# Patient Record
Sex: Female | Born: 1975 | Race: White | Hispanic: No | Marital: Single | Smoking: Current every day smoker
Health system: Southern US, Community
[De-identification: ages and names within clinical notes are randomized; demographics above are authoritative.]

## PROBLEM LIST (undated history)

## (undated) DIAGNOSIS — F32A Depression, unspecified: Secondary | ICD-10-CM

## (undated) DIAGNOSIS — F319 Bipolar disorder, unspecified: Secondary | ICD-10-CM

## (undated) DIAGNOSIS — F329 Major depressive disorder, single episode, unspecified: Secondary | ICD-10-CM

## (undated) HISTORY — PX: TONSILLECTOMY: SUR1361

## (undated) HISTORY — PX: FOOT SURGERY: SHX648

## (undated) HISTORY — PX: PARTIAL HYSTERECTOMY: SHX80

## (undated) HISTORY — PX: TUBAL LIGATION: SHX77

---

## 2001-09-14 ENCOUNTER — Inpatient Hospital Stay (HOSPITAL_COMMUNITY): Admission: AD | Admit: 2001-09-14 | Discharge: 2001-09-14 | Payer: Self-pay | Admitting: Sports Medicine

## 2001-09-24 ENCOUNTER — Emergency Department (HOSPITAL_COMMUNITY): Admission: EM | Admit: 2001-09-24 | Discharge: 2001-09-24 | Payer: Self-pay | Admitting: Emergency Medicine

## 2001-09-24 ENCOUNTER — Encounter: Payer: Self-pay | Admitting: Emergency Medicine

## 2002-01-28 ENCOUNTER — Inpatient Hospital Stay (HOSPITAL_COMMUNITY): Admission: AD | Admit: 2002-01-28 | Discharge: 2002-01-28 | Payer: Self-pay | Admitting: *Deleted

## 2002-01-31 ENCOUNTER — Inpatient Hospital Stay (HOSPITAL_COMMUNITY): Admission: AD | Admit: 2002-01-31 | Discharge: 2002-01-31 | Payer: Self-pay | Admitting: *Deleted

## 2002-02-03 ENCOUNTER — Inpatient Hospital Stay (HOSPITAL_COMMUNITY): Admission: AD | Admit: 2002-02-03 | Discharge: 2002-02-03 | Payer: Self-pay | Admitting: Obstetrics and Gynecology

## 2003-01-04 ENCOUNTER — Inpatient Hospital Stay (HOSPITAL_COMMUNITY): Admission: AD | Admit: 2003-01-04 | Discharge: 2003-01-04 | Payer: Self-pay | Admitting: *Deleted

## 2003-01-08 ENCOUNTER — Encounter: Admission: RE | Admit: 2003-01-08 | Discharge: 2003-01-08 | Payer: Self-pay | Admitting: Obstetrics and Gynecology

## 2003-01-08 ENCOUNTER — Other Ambulatory Visit: Admission: RE | Admit: 2003-01-08 | Discharge: 2003-01-08 | Payer: Self-pay | Admitting: Obstetrics and Gynecology

## 2016-12-01 ENCOUNTER — Other Ambulatory Visit: Payer: Self-pay | Admitting: *Deleted

## 2016-12-01 DIAGNOSIS — Z1231 Encounter for screening mammogram for malignant neoplasm of breast: Secondary | ICD-10-CM

## 2016-12-25 ENCOUNTER — Encounter: Payer: Self-pay | Admitting: Radiology

## 2016-12-25 ENCOUNTER — Ambulatory Visit
Admission: RE | Admit: 2016-12-25 | Discharge: 2016-12-25 | Disposition: A | Discharge status: Home / Self Care | Payer: 59 | Source: Ambulatory Visit | Attending: *Deleted | Admitting: *Deleted

## 2016-12-25 DIAGNOSIS — Z1231 Encounter for screening mammogram for malignant neoplasm of breast: Secondary | ICD-10-CM

## 2016-12-26 ENCOUNTER — Other Ambulatory Visit: Payer: Self-pay | Admitting: *Deleted

## 2016-12-26 DIAGNOSIS — R928 Other abnormal and inconclusive findings on diagnostic imaging of breast: Secondary | ICD-10-CM

## 2017-01-04 ENCOUNTER — Ambulatory Visit
Admission: RE | Admit: 2017-01-04 | Discharge: 2017-01-04 | Disposition: A | Discharge status: Home / Self Care | Payer: 59 | Source: Ambulatory Visit | Attending: *Deleted | Admitting: *Deleted

## 2017-01-04 DIAGNOSIS — R928 Other abnormal and inconclusive findings on diagnostic imaging of breast: Secondary | ICD-10-CM

## 2017-04-07 ENCOUNTER — Encounter (HOSPITAL_COMMUNITY): Payer: Self-pay

## 2017-04-07 ENCOUNTER — Emergency Department (HOSPITAL_COMMUNITY): Payer: 59

## 2017-04-07 ENCOUNTER — Emergency Department (HOSPITAL_COMMUNITY)
Admission: EM | Admit: 2017-04-07 | Discharge: 2017-04-07 | Disposition: A | Discharge status: Home / Self Care | Payer: 59 | Attending: Emergency Medicine | Admitting: Emergency Medicine

## 2017-04-07 DIAGNOSIS — Y939 Activity, unspecified: Secondary | ICD-10-CM | POA: Diagnosis not present

## 2017-04-07 DIAGNOSIS — F17210 Nicotine dependence, cigarettes, uncomplicated: Secondary | ICD-10-CM | POA: Diagnosis not present

## 2017-04-07 DIAGNOSIS — Y929 Unspecified place or not applicable: Secondary | ICD-10-CM | POA: Diagnosis not present

## 2017-04-07 DIAGNOSIS — S51811A Laceration without foreign body of right forearm, initial encounter: Secondary | ICD-10-CM | POA: Diagnosis present

## 2017-04-07 DIAGNOSIS — W25XXXA Contact with sharp glass, initial encounter: Secondary | ICD-10-CM | POA: Diagnosis not present

## 2017-04-07 DIAGNOSIS — Y999 Unspecified external cause status: Secondary | ICD-10-CM | POA: Diagnosis not present

## 2017-04-07 DIAGNOSIS — Z23 Encounter for immunization: Secondary | ICD-10-CM | POA: Insufficient documentation

## 2017-04-07 MED ORDER — LIDOCAINE HCL 2 % IJ SOLN
20.0000 mL | Freq: Once | INTRAMUSCULAR | Status: AC
  Administered 2017-04-07: 400 mg

## 2017-04-07 MED ORDER — TETANUS-DIPHTH-ACELL PERTUSSIS 5-2.5-18.5 LF-MCG/0.5 IM SUSP
0.5000 mL | Freq: Once | INTRAMUSCULAR | Status: AC
  Administered 2017-04-07: 0.5 mL via INTRAMUSCULAR
  Filled 2017-04-07: qty 0.5

## 2017-04-07 MED ORDER — LIDOCAINE-EPINEPHRINE (PF) 2 %-1:200000 IJ SOLN
INTRAMUSCULAR | Status: AC
  Filled 2017-04-07: qty 20

## 2017-04-07 NOTE — ED Provider Notes (Signed)
[facility]-EMERGENCY DEPT Provider Note   CSN: [id number] Arrival date & time: 04/07/17  1703  By signing my name below, I, Thelma Barge, attest that this documentation has been prepared under the direction and in the presence of Glenford Bayley, PA-C. Electronically Signed: Thelma Barge, Scribe. 04/07/17. 5:40 PM.  History   Chief Complaint Chief Complaint  Patient presents with  . Laceration   The history is provided by the patient. No language interpreter was used.    HPI Comments: Abigail Rhodes is a 41 y.o. female who presents to the Emergency Department complaining of constant, gradually worsening right-sided forearm s/p an accidental laceration to the area. She has associated numbness/tingling "all over" and dizziness, but this is likely due to patient's anxiety over the situation. She reports hyperventilating on arrival to the ED. Her numbness/tingling is resolving. She states she was carrying broken glass in a container up a staircase when the glass cut her wrist. The wound was cleaned PTA. Tetanus not UTD.  History reviewed. No pertinent past medical history.  There are no active problems to display for this patient.   Past Surgical History:  Procedure Laterality Date  . FOOT SURGERY    . PARTIAL HYSTERECTOMY    . TONSILLECTOMY    . TUBAL LIGATION      OB History    No data available       Home Medications    Prior to Admission medications   Not on File    Family History History reviewed. No pertinent family history.  Social History Social History  Substance Use Topics  . Smoking status: Current Every Day Smoker  . Smokeless tobacco: Never Used  . Alcohol use Yes     Comment: social     Allergies   Patient has no known allergies.   Review of Systems Review of Systems  Skin: Positive for wound.  Neurological: Positive for dizziness and numbness.     Physical Exam Updated Vital Signs BP (!) 134/92   Pulse 80   Temp 97.8 F (36.6 C) (Oral)   Resp 18    SpO2 100%   Physical Exam  Constitutional: She appears well-developed and well-nourished. No distress.  HENT:  Head: Normocephalic and atraumatic.  Mouth/Throat: Oropharynx is clear and moist. No oropharyngeal exudate.  Eyes: Pupils are equal, round, and reactive to light. Conjunctivae are normal. Right eye exhibits no discharge. Left eye exhibits no discharge. No scleral icterus.  Neck: Normal range of motion. Neck supple. No thyromegaly present.  Cardiovascular: Normal rate, regular rhythm, normal heart sounds and intact distal pulses.  Exam reveals no gallop and no friction rub.   No murmur heard. Pulmonary/Chest: Effort normal and breath sounds normal. No stridor. No respiratory distress. She has no wheezes. She has no rales.  Musculoskeletal: She exhibits no edema.  Lymphadenopathy:    She has no cervical adenopathy.  Neurological: She is alert. Coordination normal.  Skin: Skin is warm and dry. No rash noted. She is not diaphoretic. No pallor.  2 cm laceration to dorsal forearm, adipose exposed. No active bleeding; no tendon or foreign body noted in bloodless field. Normal sensation, full ROM to the hand, equal bilateral grip strength, radial pulses intact  Psychiatric: She has a normal mood and affect.  Nursing note and vitals reviewed.    ED Treatments / Results  DIAGNOSTIC STUDIES: Oxygen Saturation is 100% on RA, normal by my interpretation.    COORDINATION OF CARE: 5:40 PM Discussed treatment plan with pt at bedside  and pt agreed to plan.  Labs (all labs ordered are listed, but only abnormal results are displayed) Labs Reviewed - No data to display  EKG  EKG Interpretation None       Radiology No results found.  Procedures Procedures (including critical care time)  LACERATION REPAIR Performed by: Emi HolesAlexandra M Jilleen Essner Authorized by: Emi HolesAlexandra M Rebeca Valdivia Consent: Verbal consent obtained. Risks and benefits: risks, benefits and alternatives were discussed Consent  given by: patient Patient identity confirmed: provided demographic data Prepped and Draped in normal sterile fashion Wound explored  Laceration Location: R forearm  Laceration Length: 2cm  No Foreign Bodies seen or palpated  Anesthesia: local infiltration  Local anesthetic: lidocaine 2% w/ epinephrine  Anesthetic total: 3 ml  Irrigation method: syringe Amount of cleaning: standard  Skin closure: 4-0 Ethilon  Number of sutures: 4  Technique: Simple interrupted   Patient tolerance: Patient tolerated the procedure well with no immediate complications.   Medications Ordered in ED Medications  lidocaine-EPINEPHrine (XYLOCAINE W/EPI) 2 %-1:200000 (PF) injection (not administered)  lidocaine (XYLOCAINE) 2 % (with pres) injection 400 mg (400 mg Infiltration Given by Other 04/07/17 1937)  Tdap (BOOSTRIX) injection 0.5 mL (0.5 mLs Intramuscular Given 04/07/17 1935)     Initial Impression / Assessment and Plan / ED Course  I have reviewed the triage vital signs and the nursing notes.  Pertinent labs & imaging results that were available during my care of the patient were reviewed by me and considered in my medical decision making (see chart for details).     Tetanus updated in ED. Laceration occurred < 12 hours prior to repair. Patient refused x-ray to search for foreign body due to financial constraints. Patient understands risks of small foreign body retained in the wound, although none visualized in bloodless field after copious irrigation. Discussed laceration care with pt and answered questions. Pt to f-u for suture removal in 10 days and wound check sooner should there be signs of dehiscence or infection. Patient's anxiety improved and resolved at discharge. Patient no longer hyperventilating and feeling numbness and tingling all over. Pt is hemodynamically stable with no complaints prior to dc.  Patient discharged in satisfactory condition.  Final Clinical Impressions(s) / ED  Diagnoses   Final diagnoses:  Laceration of right forearm, initial encounter    New Prescriptions There are no discharge medications for this patient. I personally performed the services described in this documentation, which was scribed in my presence. The recorded information has been reviewed and is accurate.    Emi HolesLaw, Saadiya Wilfong M, PA-C 04/07/17 1941    Mancel BaleWentz, Elliott, MD 04/07/17 [phone]

## 2017-04-07 NOTE — Discharge Instructions (Signed)
Treatment: Keep your wound dry and dressing applied until this time tomorrow. After 24 hours, you may wash with warm soapy water. Dry and apply antibiotic ointment and clean dressing. Do this daily until your sutures are removed. ° °Follow-up: Please follow-up with your primary care provider or return to emergency department in 7-10 days for suture removal. Be aware of signs of infection: fever, increasing pain, redness, swelling, drainage from the area. Please call your primary care provider or return to emergency department if you develop any of these symptoms or if any of the sutures come out prior to removal. Please return to the emergency department if you develop any other new or worsening symptoms. ° °

## 2017-04-07 NOTE — ED Notes (Signed)
Pt refusing xray at this time

## 2017-04-07 NOTE — ED Triage Notes (Signed)
Pt here with accidental laceration to right wrist.  Pt states completely accidental.  Bleeding controlled.

## 2017-06-08 ENCOUNTER — Other Ambulatory Visit: Payer: Self-pay | Admitting: *Deleted

## 2017-06-08 DIAGNOSIS — R921 Mammographic calcification found on diagnostic imaging of breast: Secondary | ICD-10-CM

## 2017-06-08 DIAGNOSIS — N6320 Unspecified lump in the left breast, unspecified quadrant: Secondary | ICD-10-CM

## 2017-07-09 ENCOUNTER — Other Ambulatory Visit: Payer: Self-pay | Admitting: *Deleted

## 2017-07-09 ENCOUNTER — Ambulatory Visit
Admission: RE | Admit: 2017-07-09 | Discharge: 2017-07-09 | Disposition: A | Discharge status: Home / Self Care | Payer: 59 | Source: Ambulatory Visit | Attending: *Deleted | Admitting: *Deleted

## 2017-07-09 DIAGNOSIS — N6320 Unspecified lump in the left breast, unspecified quadrant: Secondary | ICD-10-CM

## 2017-07-09 DIAGNOSIS — R921 Mammographic calcification found on diagnostic imaging of breast: Secondary | ICD-10-CM

## 2017-10-20 ENCOUNTER — Inpatient Hospital Stay (HOSPITAL_COMMUNITY): Payer: 59

## 2017-10-20 ENCOUNTER — Encounter (HOSPITAL_COMMUNITY): Payer: Self-pay

## 2017-10-20 ENCOUNTER — Inpatient Hospital Stay (HOSPITAL_COMMUNITY)
Admission: EM | Admit: 2017-10-20 | Discharge: 2017-10-22 | DRG: 918 | Disposition: A | Discharge status: Other Acute Inpatient Hospital | Payer: 59 | Attending: Internal Medicine | Admitting: Internal Medicine

## 2017-10-20 ENCOUNTER — Other Ambulatory Visit: Payer: Self-pay

## 2017-10-20 DIAGNOSIS — Z8249 Family history of ischemic heart disease and other diseases of the circulatory system: Secondary | ICD-10-CM | POA: Diagnosis not present

## 2017-10-20 DIAGNOSIS — F17210 Nicotine dependence, cigarettes, uncomplicated: Secondary | ICD-10-CM | POA: Diagnosis present

## 2017-10-20 DIAGNOSIS — R000 Tachycardia, unspecified: Secondary | ICD-10-CM | POA: Diagnosis present

## 2017-10-20 DIAGNOSIS — R569 Unspecified convulsions: Secondary | ICD-10-CM | POA: Diagnosis present

## 2017-10-20 DIAGNOSIS — I4581 Long QT syndrome: Secondary | ICD-10-CM | POA: Diagnosis present

## 2017-10-20 DIAGNOSIS — F329 Major depressive disorder, single episode, unspecified: Secondary | ICD-10-CM | POA: Diagnosis present

## 2017-10-20 DIAGNOSIS — Z90710 Acquired absence of both cervix and uterus: Secondary | ICD-10-CM | POA: Diagnosis not present

## 2017-10-20 DIAGNOSIS — T50902A Poisoning by unspecified drugs, medicaments and biological substances, intentional self-harm, initial encounter: Secondary | ICD-10-CM

## 2017-10-20 DIAGNOSIS — Z888 Allergy status to other drugs, medicaments and biological substances status: Secondary | ICD-10-CM

## 2017-10-20 DIAGNOSIS — T450X2A Poisoning by antiallergic and antiemetic drugs, intentional self-harm, initial encounter: Secondary | ICD-10-CM | POA: Diagnosis present

## 2017-10-20 DIAGNOSIS — T50901A Poisoning by unspecified drugs, medicaments and biological substances, accidental (unintentional), initial encounter: Secondary | ICD-10-CM | POA: Diagnosis present

## 2017-10-20 HISTORY — DX: Major depressive disorder, single episode, unspecified: F32.9

## 2017-10-20 HISTORY — DX: Depression, unspecified: F32.A

## 2017-10-20 LAB — CBC WITH DIFFERENTIAL/PLATELET
BASOS ABS: 0 10*3/uL (ref 0.0–0.1)
BASOS PCT: 0 %
EOS ABS: 0 10*3/uL (ref 0.0–0.7)
Eosinophils Relative: 0 %
HCT: 42.3 % (ref 36.0–46.0)
HEMOGLOBIN: 15.3 g/dL — AB (ref 12.0–15.0)
LYMPHS ABS: 1 10*3/uL (ref 0.7–4.0)
Lymphocytes Relative: 6 %
MCH: 33.2 pg (ref 26.0–34.0)
MCHC: 36.2 g/dL — ABNORMAL HIGH (ref 30.0–36.0)
MCV: 91.8 fL (ref 78.0–100.0)
Monocytes Absolute: 0.2 10*3/uL (ref 0.1–1.0)
Monocytes Relative: 1 %
Neutro Abs: 16.2 10*3/uL — ABNORMAL HIGH (ref 1.7–7.7)
Neutrophils Relative %: 93 %
Platelets: 364 10*3/uL (ref 150–400)
RBC: 4.61 MIL/uL (ref 3.87–5.11)
RDW: 12.2 % (ref 11.5–15.5)
WBC: 17.4 10*3/uL — AB (ref 4.0–10.5)

## 2017-10-20 LAB — I-STAT BETA HCG BLOOD, ED (MC, WL, AP ONLY): I-stat hCG, quantitative: <5 m[IU]/mL (ref <5)

## 2017-10-20 LAB — URINALYSIS, ROUTINE W REFLEX MICROSCOPIC
Bilirubin Urine: NEGATIVE
GLUCOSE, UA: NEGATIVE mg/dL
Hgb urine dipstick: NEGATIVE
KETONES UR: NEGATIVE mg/dL
Leukocytes, UA: NEGATIVE
Nitrite: NEGATIVE
PROTEIN: 30 mg/dL — AB
Specific Gravity, Urine: 1.008 (ref 1.005–1.030)
pH: 6 (ref 5.0–8.0)

## 2017-10-20 LAB — RAPID URINE DRUG SCREEN, HOSP PERFORMED
Amphetamines: NOT DETECTED
BARBITURATES: NOT DETECTED
BENZODIAZEPINES: NOT DETECTED
Cocaine: NOT DETECTED
OPIATES: NOT DETECTED
TETRAHYDROCANNABINOL: NOT DETECTED

## 2017-10-20 LAB — COMPREHENSIVE METABOLIC PANEL
ALBUMIN: 4.3 g/dL (ref 3.5–5.0)
ALK PHOS: 64 U/L (ref 38–126)
ALT: 17 U/L (ref 14–54)
AST: 21 U/L (ref 15–41)
Anion gap: 9 (ref 5–15)
BUN: 9 mg/dL (ref 6–20)
CALCIUM: 8.7 mg/dL — AB (ref 8.9–10.3)
CHLORIDE: 110 mmol/L (ref 101–111)
CO2: 19 mmol/L — AB (ref 22–32)
CREATININE: 0.75 mg/dL (ref 0.44–1.00)
GFR calc Af Amer: >60 mL/min (ref >60)
GFR calc non Af Amer: >60 mL/min (ref >60)
GLUCOSE: 112 mg/dL — AB (ref 65–99)
Potassium: 3.5 mmol/L (ref 3.5–5.1)
SODIUM: 138 mmol/L (ref 135–145)
Total Bilirubin: 0.5 mg/dL (ref 0.3–1.2)
Total Protein: 7.7 g/dL (ref 6.5–8.1)

## 2017-10-20 LAB — ACETAMINOPHEN LEVEL: Acetaminophen (Tylenol), Serum: <10 ug/mL — ABNORMAL LOW (ref 10–30)

## 2017-10-20 LAB — ETHANOL

## 2017-10-20 LAB — CBG MONITORING, ED: GLUCOSE-CAPILLARY: 111 mg/dL — AB (ref 65–99)

## 2017-10-20 LAB — SALICYLATE LEVEL: Salicylate Lvl: <7 mg/dL (ref 2.8–30.0)

## 2017-10-20 MED ORDER — LORAZEPAM 2 MG/ML IJ SOLN
1.0000 mg | Freq: Once | INTRAMUSCULAR | Status: AC
  Administered 2017-10-20: 1 mg via INTRAVENOUS
  Filled 2017-10-20: qty 1

## 2017-10-20 MED ORDER — SODIUM CHLORIDE 0.9 % IV BOLUS (SEPSIS)
1000.0000 mL | Freq: Once | INTRAVENOUS | Status: AC
  Administered 2017-10-20: 1000 mL via INTRAVENOUS

## 2017-10-20 MED ORDER — SODIUM CHLORIDE 0.9 % IV SOLN
Freq: Once | INTRAVENOUS | Status: AC
  Administered 2017-10-20: 23:00:00 via INTRAVENOUS

## 2017-10-20 MED ORDER — PHYSOSTIGMINE SALICYLATE 1 MG/ML IJ SOLN
0.5000 mg | Freq: Once | INTRAMUSCULAR | Status: AC
  Administered 2017-10-21: 0.5 mg via INTRAVENOUS
  Filled 2017-10-20: qty 2

## 2017-10-20 NOTE — H&P (Signed)
[facility] H&P   Patient Demographics:    Abigail Rhodes, is a 42 y.o. female  MRN: [id number]   DOB - 25-Apr-1976  Admit Date - 10/20/2017  Outpatient Primary MD for the patient is Christ Kick, [place]  Referring MD/NP/PA: Reinaldo Meeker  Outpatient Specialists:     Patient coming from: home  Chief Complaint  Patient presents with  . Ingestion  . Suicide Attempt      HPI:    Abigail Rhodes  is a 42 y.o. female, w depression apparently attempted suicide attempt by ingesting 75 pills of benadryl.  Pt apparently had a boyfriend who witnessed a generalized convulsion lasting about 1 minute.    In ED,   CT brain pending.  EKG ST at 148, nl axis, QTC 528  Na 138, K 3.5, Bun 9, Creatinine 0.75 Ast 21, Alt 17 Tylenol <10 salycilate <7.0 etoh <10 UDS negative  Pt will be admitted for suicide attempt and benadryl OD, and ? Seizure.        Review of systems:    In addition to the HPI above,  No Fever-chills, No Headache, No changes with Vision or hearing, No problems swallowing food or Liquids, No Chest pain, Cough or Shortness of Breath, No Abdominal pain, No Nausea or Vommitting, Bowel movements are regular, No Blood in stool or Urine, No dysuria, No new skin rashes or bruises, No new joints pains-aches,  No new weakness, tingling, numbness in any extremity, No recent weight gain or loss, No polyuria, polydypsia or polyphagia, No significant Mental Stressors.  A full 10 point Review of Systems was done, except as stated above, all other Review of Systems were negative.   With Past History of the following :    Past Medical History:  Diagnosis Date  . Depression       Past Surgical History:  Procedure Laterality Date  . FOOT SURGERY    . PARTIAL HYSTERECTOMY    . TONSILLECTOMY    . TUBAL LIGATION        Social History:     Social History   Tobacco Use    . Smoking status: Current Every Day Smoker    Types: Cigarettes  . Smokeless tobacco: Never Used  Substance Use Topics  . Alcohol use: Yes    Comment: social     Lives - at home  Mobility - walks by self   Family History :     Family History  Problem Relation Age of Onset  . Hypertension Father       Home Medications:   Prior to Admission medications   Medication Sig Start Date End Date Taking? Authorizing Provider  PREVIDENT 5000 DRY MOUTH 1.1 % GEL dental gel Take 1 application by mouth 2 (two) times daily. 10/17/17  Yes [provider]  sertraline (ZOLOFT) 25 MG tablet Take 25 mg by mouth daily. 10/17/17  Yes [provider]     Allergies:     Allergies  Allergen Reactions  . Pepto-Bismol [Bismuth Subsalicylate] Other (See Comments)    antsy     Physical Exam:   Vitals  Blood pressure (!) 152/103, pulse (!) 116, temperature 98.5 F (36.9 C), temperature source Oral, resp. rate (!) 31, height 5\' 3"  (1.6 m), weight 70.3 kg (155 lb), SpO2 96 %.   1. General  lying in bed in NAD,    2. Normal affect and insight, Not Suicidal or Homicidal, Awake Alert, Oriented X 3.  3. No F.N deficits, ALL C.Nerves Intact, Strength 5/5 all 4 extremities, Sensation intact all 4 extremities, Plantars down going.  4. Ears and Eyes appear Normal, Conjunctivae clear, PERRLA. Moist Oral Mucosa.  5. Supple Neck, No JVD, No cervical lymphadenopathy appriciated, No Carotid Bruits.  6. Symmetrical Chest wall movement, Good air movement bilaterally, CTAB.  7. RRR, No Gallops, Rubs or Murmurs, No Parasternal Heave.  8. Positive Bowel Sounds, Abdomen Soft, No tenderness, No organomegaly appriciated,No rebound -guarding or rigidity.  9.  No Cyanosis, Normal Skin Turgor, No Skin Rash or Bruise.  10. Good muscle tone,  joints appear normal , no effusions, Normal ROM.  11. No Palpable Lymph Nodes in Neck or Axillae    Data Review:    CBC Recent Labs  Lab  10/20/17 2043  WBC 17.4*  HGB 15.3*  HCT 42.3  PLT 364  MCV 91.8  MCH 33.2  MCHC 36.2*  RDW 12.2  LYMPHSABS 1.0  MONOABS 0.2  EOSABS 0.0  BASOSABS 0.0   ------------------------------------------------------------------------------------------------------------------  Chemistries  Recent Labs  Lab 10/20/17 2043  NA 138  K 3.5  CL 110  CO2 19*  GLUCOSE 112*  BUN 9  CREATININE 0.75  CALCIUM 8.7*  AST 21  ALT 17  ALKPHOS 64  BILITOT 0.5   ------------------------------------------------------------------------------------------------------------------ estimated creatinine clearance is 88 mL/min (by C-G formula based on SCr of 0.75 mg/dL). ------------------------------------------------------------------------------------------------------------------ No results for input(s): TSH, T4TOTAL, T3FREE, THYROIDAB in the last 72 hours.  Invalid input(s): FREET3  Coagulation profile No results for input(s): INR, PROTIME in the last 168 hours. ------------------------------------------------------------------------------------------------------------------- No results for input(s): DDIMER in the last 72 hours. -------------------------------------------------------------------------------------------------------------------  Cardiac Enzymes No results for input(s): CKMB, TROPONINI, MYOGLOBIN in the last 168 hours.  Invalid input(s): CK ------------------------------------------------------------------------------------------------------------------ No results found for: BNP   ---------------------------------------------------------------------------------------------------------------  Urinalysis    Component Value Date/Time   COLORURINE YELLOW 10/20/2017 2047   APPEARANCEUR CLEAR 10/20/2017 2047   LABSPEC 1.008 10/20/2017 2047   PHURINE 6.0 10/20/2017 2047   GLUCOSEU NEGATIVE 10/20/2017 2047   HGBUR NEGATIVE 10/20/2017 2047   BILIRUBINUR NEGATIVE 10/20/2017  2047   KETONESUR NEGATIVE 10/20/2017 2047   PROTEINUR 30 (A) 10/20/2017 2047   NITRITE NEGATIVE 10/20/2017 2047   LEUKOCYTESUR NEGATIVE 10/20/2017 2047    ----------------------------------------------------------------------------------------------------------------   Imaging Results:    No results found.    Assessment & Plan:    Principal Problem:   Overdose Active Problems:   Tachycardia   Seizure (HCC)    Overdose , with agitation Physostigmine 0.5mg  iv x1 Monitor QTC Check 12 lead EKG in am  Suicide attempt 1:1 direct obs for suicide attempt Please contact psychatry in AM for evaluation  Tachycardia Tele tsh Trop I q6h x3 Consider echo if not improving   ? Seizure likely due to overdose CT brain now MRI brain  EEG      DVT Prophylaxis  Lovenox - SCDs   AM  Labs Ordered, also please review Full Orders  Family Communication: Admission, patients condition and plan of care including tests being ordered have been discussed with the patient FULL CODE who indicate understanding and agree with the plan and Code Status.  Code Status FULL CODE  Likely DC to  home  Condition GUARDED    Consults called: none  Admission status: inpatient   Time spent in minutes : 45   Pearson Grippe M.D on 10/20/2017 at 11:35 PM  Between 7am to 7pm - Pager - [phone]  . After 7pm go to [email or web address] - password [facility]  [facility] - Office  [phone]

## 2017-10-20 NOTE — ED Triage Notes (Signed)
Pt comes from home. Pt brought in via [facility]. Pt took about 25mg  dyphehydramine around 75 to 100 capsules estimated. Witness states pt had seizure lasting about 1 min with unresponsive episode very several minutes and then coming too shortly there after. Boyfriend saw seizure happen and is the one that called.

## 2017-10-20 NOTE — ED Notes (Signed)
Talked with poison control about benadryl overdose discussed with [facility] ED provider.

## 2017-10-20 NOTE — ED Provider Notes (Signed)
[facility]-EMERGENCY DEPT Provider Note   CSN: [id number] Arrival date & time: 10/20/17  2028     History   Chief Complaint Chief Complaint  Patient presents with  . Ingestion  . Suicide Attempt    HPI Abigail Rhodes is a 42 y.o. female brought to ED via EMS for intentional Benadryl overdose at 5 PM today. EMS approximates at least 75 pills of 25 mg Benadryl taken. Boyfriend who was with patient witnessed a generalized convulsion lasting approximately 1 minute, patient was confused and not following commands afterwards. Pt admits attempting suicide today with benadryl. Pt reports thirst but no pain. Level 5 caveat applies. H/o suicide attempts before.   HPI  History reviewed. No pertinent past medical history.  Patient Active Problem List   Diagnosis Date Noted  . Overdose 10/20/2017    Past Surgical History:  Procedure Laterality Date  . FOOT SURGERY    . PARTIAL HYSTERECTOMY    . TONSILLECTOMY    . TUBAL LIGATION      OB History    No data available       Home Medications    Prior to Admission medications   Medication Sig Start Date End Date Taking? Authorizing Provider  PREVIDENT 5000 DRY MOUTH 1.1 % GEL dental gel Take 1 application by mouth 2 (two) times daily. 10/17/17  Yes [provider]  sertraline (ZOLOFT) 25 MG tablet Take 25 mg by mouth daily. 10/17/17  Yes [provider]    Family History No family history on file.  Social History Social History   Tobacco Use  . Smoking status: Current Every Day Smoker  . Smokeless tobacco: Never Used  Substance Use Topics  . Alcohol use: Yes    Comment: social  . Drug use: No     Allergies   Pepto-bismol [bismuth subsalicylate]   Review of Systems Review of Systems  Unable to perform ROS: Acuity of condition     Physical Exam Updated Vital Signs BP (!) 142/107   Pulse (!) 119   Temp 98.5 F (36.9 C) (Oral)   Resp (!) 34   Ht 5\' 3"  (1.6 m)   Wt 70.3  kg (155 lb)   SpO2 96%   BMI 27.46 kg/m   Physical Exam  Constitutional: She is oriented to person, place, and time. She appears well-developed and well-nourished.  HENT:  Head: Normocephalic and atraumatic.  Right Ear: External ear normal.  Left Ear: External ear normal.  Nose: Nose normal.  Dry mucous membranes and lips. Right lateral tongue injury/bleeding. Dentition intact no malocclusion.   Eyes: Conjunctivae are normal. No scleral icterus.  Slightly dilated pupils but reactive to light. EOMs grossly normal   Neck: Normal range of motion. Neck supple.  Cardiovascular: Regular rhythm and normal heart sounds.  No murmur heard. Tachycardic 145-150s. No LE edema.   Pulmonary/Chest: Effort normal and breath sounds normal. She has no wheezes.  Abdominal: Soft. There is no tenderness.  Musculoskeletal: Normal range of motion. She exhibits no deformity.  Neurological: She is alert and oriented to person, place, and time.  Can tall me her name.  Attempts to communicate but slow, slurred speech.  Weak hand grip bilaterally, constant tremor to bilateral upper extremities.  Knee DTRs intact bilaterally.  Unable to perform full exam given acuity of condition.   Skin: Skin is warm and dry. Capillary refill takes less than 2 seconds.  Psychiatric: She has a normal mood and affect. Her behavior is normal.  Judgment and thought content normal.  Nursing note and vitals reviewed.    ED Treatments / Results  Labs (all labs ordered are listed, but only abnormal results are displayed) Labs Reviewed  CBC WITH DIFFERENTIAL/PLATELET - Abnormal; Notable for the following components:      Result Value   WBC 17.4 (*)    Hemoglobin 15.3 (*)    MCHC 36.2 (*)    Neutro Abs 16.2 (*)    All other components within normal limits  COMPREHENSIVE METABOLIC PANEL - Abnormal; Notable for the following components:   CO2 19 (*)    Glucose, Bld 112 (*)    Calcium 8.7 (*)    All other components within  normal limits  ACETAMINOPHEN LEVEL - Abnormal; Notable for the following components:   Acetaminophen (Tylenol), Serum <10 (*)    All other components within normal limits  URINALYSIS, ROUTINE W REFLEX MICROSCOPIC - Abnormal; Notable for the following components:   Protein, ur 30 (*)    Bacteria, UA RARE (*)    Squamous Epithelial / LPF 0-5 (*)    All other components within normal limits  CBG MONITORING, ED - Abnormal; Notable for the following components:   Glucose-Capillary 111 (*)    All other components within normal limits  SALICYLATE LEVEL  ETHANOL  RAPID URINE DRUG SCREEN, HOSP PERFORMED  I-STAT BETA HCG BLOOD, ED ([facility], [facility], [facility] ONLY)    EKG  EKG Interpretation  Date/Time:  Saturday October 20 2017 22:40:53 EST Ventricular Rate:  132 PR Interval:    QRS Duration: 92 QT Interval:  356 QTC Calculation: 528 R Axis:   58 Text Interpretation:  Sinus tachycardia Posterior infarct, old Borderline repolarization abnormality Prolonged QT interval No significant change since last tracing Confirmed by Shaune PollackIsaacs, Cameron [phone]([id number]) on 10/20/2017 11:03:58 PM       Radiology No results found.  Procedures Procedures (including critical care time)  Medications Ordered in ED Medications  sodium chloride 0.9 % bolus 1,000 mL (0 mLs Intravenous Stopped 10/20/17 2144)  LORazepam (ATIVAN) injection 1 mg (1 mg Intravenous Given 10/20/17 2051)  sodium chloride 0.9 % bolus 1,000 mL (0 mLs Intravenous Stopped 10/20/17 2241)  0.9 %  sodium chloride infusion ( Intravenous New Bag/Given 10/20/17 2311)  LORazepam (ATIVAN) injection 1 mg (1 mg Intravenous Given 10/20/17 2311)     Initial Impression / Assessment and Plan / ED Course  I have reviewed the triage vital signs and the nursing notes.  Pertinent labs & imaging results that were available during my care of the patient were reviewed by me and considered in my medical decision making (see chart for details).  Clinical Course as of Oct 20 2316  Sat  Oct 20, 2017  2205 WBC: (!) 17.4 [CG]  2309 Sinus tachycardia Posterior infarct, old Borderline repolarization abnormality Prolonged QT interval No significant change since last EKG 12-Lead [CG]    Clinical Course User Index [CG] Liberty HandyGibbons, Joannie Medine J, PA-C   Spoke to poison control who recommends monitor for at least 6 hours or until clinical improvement, repeat EKGs to monitor QRS prolongation, sodium bicarb boluses 1-2 amp prn for QRS prolongation, benzodiazepines for seizure like activity.   Work up remarkable for leukocytosis. Prolonged QTc, but normal QRS. Tachycardia has improved from 150s to 120s. .   Final Clinical Impressions(s) / ED Diagnoses   Re-evaluated pt who is still tremorous and unable to speak, although trying to. She has received 2 L IVF and on maintance fluids now, 2 mg ativan.  Given persistent tachycardia will request observation. Spoke to Dr Selena Batten who will admit pt. Pt shared with SP.  Final diagnoses:  Intentional drug overdose, initial encounter [facility])    ED Discharge Orders    None       Jerrell Mylar 10/20/17 2318    Shaune Pollack, MD 10/20/17 2324

## 2017-10-20 NOTE — ED Notes (Signed)
Bed: RESB Expected date:  Expected time:  Means of arrival:  Comments: 89f overdose

## 2017-10-20 NOTE — ED Notes (Signed)
Pt has in belonging bag:  Pink sweater, grey tights, white underwear, green wife beater, black bra, [place] driver ID behind the nurses station [id number].

## 2017-10-20 NOTE — ED Notes (Signed)
Bed: [id number] Expected date:  Expected time:  Means of arrival:  Comments: Hold resus B

## 2017-10-21 DIAGNOSIS — R000 Tachycardia, unspecified: Secondary | ICD-10-CM

## 2017-10-21 DIAGNOSIS — Z630 Problems in relationship with spouse or partner: Secondary | ICD-10-CM

## 2017-10-21 DIAGNOSIS — F419 Anxiety disorder, unspecified: Secondary | ICD-10-CM

## 2017-10-21 DIAGNOSIS — R569 Unspecified convulsions: Secondary | ICD-10-CM

## 2017-10-21 DIAGNOSIS — F17210 Nicotine dependence, cigarettes, uncomplicated: Secondary | ICD-10-CM

## 2017-10-21 DIAGNOSIS — R450 Nervousness: Secondary | ICD-10-CM

## 2017-10-21 DIAGNOSIS — T450X2A Poisoning by antiallergic and antiemetic drugs, intentional self-harm, initial encounter: Principal | ICD-10-CM

## 2017-10-21 DIAGNOSIS — T50902A Poisoning by unspecified drugs, medicaments and biological substances, intentional self-harm, initial encounter: Secondary | ICD-10-CM

## 2017-10-21 DIAGNOSIS — F332 Major depressive disorder, recurrent severe without psychotic features: Secondary | ICD-10-CM

## 2017-10-21 DIAGNOSIS — G4700 Insomnia, unspecified: Secondary | ICD-10-CM

## 2017-10-21 DIAGNOSIS — T1491XA Suicide attempt, initial encounter: Secondary | ICD-10-CM

## 2017-10-21 LAB — COMPREHENSIVE METABOLIC PANEL
ALT: 16 U/L (ref 14–54)
ANION GAP: 7 (ref 5–15)
AST: 22 U/L (ref 15–41)
Albumin: 3.9 g/dL (ref 3.5–5.0)
Alkaline Phosphatase: 54 U/L (ref 38–126)
BILIRUBIN TOTAL: 0.3 mg/dL (ref 0.3–1.2)
BUN: 6 mg/dL (ref 6–20)
CALCIUM: 8.3 mg/dL — AB (ref 8.9–10.3)
CO2: 19 mmol/L — ABNORMAL LOW (ref 22–32)
Chloride: 112 mmol/L — ABNORMAL HIGH (ref 101–111)
Creatinine, Ser: 0.62 mg/dL (ref 0.44–1.00)
GFR calc Af Amer: >60 mL/min (ref >60)
GFR calc non Af Amer: >60 mL/min (ref >60)
Glucose, Bld: 91 mg/dL (ref 65–99)
POTASSIUM: 3.3 mmol/L — AB (ref 3.5–5.1)
Sodium: 138 mmol/L (ref 135–145)
Total Protein: 6.5 g/dL (ref 6.5–8.1)

## 2017-10-21 LAB — TSH: TSH: 1.267 u[IU]/mL (ref 0.350–4.500)

## 2017-10-21 LAB — CBC
HCT: 37.2 % (ref 36.0–46.0)
HEMOGLOBIN: 13.2 g/dL (ref 12.0–15.0)
MCH: 32.9 pg (ref 26.0–34.0)
MCHC: 35.5 g/dL (ref 30.0–36.0)
MCV: 92.8 fL (ref 78.0–100.0)
PLATELETS: 323 10*3/uL (ref 150–400)
RBC: 4.01 MIL/uL (ref 3.87–5.11)
RDW: 12.5 % (ref 11.5–15.5)
WBC: 15 10*3/uL — ABNORMAL HIGH (ref 4.0–10.5)

## 2017-10-21 LAB — CK TOTAL AND CKMB (NOT AT ARMC)
CK TOTAL: 558 U/L — AB (ref 38–234)
CK, MB: 4 ng/mL (ref 0.5–5.0)
Relative Index: 0.7 (ref 0.0–2.5)

## 2017-10-21 LAB — MAGNESIUM: Magnesium: 2 mg/dL (ref 1.7–2.4)

## 2017-10-21 LAB — HIV ANTIBODY (ROUTINE TESTING W REFLEX): HIV Screen 4th Generation wRfx: NONREACTIVE

## 2017-10-21 MED ORDER — ACETAMINOPHEN 325 MG PO TABS: 650.0000 mg | ORAL_TABLET | Freq: Four times a day (QID) | ORAL | Status: DC | PRN

## 2017-10-21 MED ORDER — SODIUM CHLORIDE 0.9 % IV SOLN
INTRAVENOUS | Status: DC
  Administered 2017-10-21: 03:00:00 via INTRAVENOUS

## 2017-10-21 MED ORDER — LORAZEPAM 2 MG/ML IJ SOLN
1.0000 mg | Freq: Once | INTRAMUSCULAR | Status: AC
Start: 2017-10-21 — End: 2017-10-21
  Administered 2017-10-21: 1 mg via INTRAVENOUS
  Filled 2017-10-21: qty 1

## 2017-10-21 MED ORDER — ACETAMINOPHEN 650 MG RE SUPP: 650.0000 mg | Freq: Four times a day (QID) | RECTAL | Status: DC | PRN

## 2017-10-21 MED ORDER — LORAZEPAM 1 MG PO TABS
2.0000 mg | ORAL_TABLET | Freq: Two times a day (BID) | ORAL | Status: DC
  Administered 2017-10-21 – 2017-10-22 (×3): 2 mg via ORAL
  Filled 2017-10-21 (×3): qty 2

## 2017-10-21 MED ORDER — LORAZEPAM 2 MG/ML IJ SOLN
2.0000 mg | Freq: Four times a day (QID) | INTRAMUSCULAR | Status: DC | PRN
  Administered 2017-10-21: 2 mg via INTRAVENOUS
  Filled 2017-10-21 (×2): qty 1

## 2017-10-21 MED ORDER — NICOTINE 14 MG/24HR TD PT24
14.0000 mg | MEDICATED_PATCH | Freq: Every day | TRANSDERMAL | Status: DC
  Administered 2017-10-21 – 2017-10-22 (×2): 14 mg via TRANSDERMAL
  Filled 2017-10-21 (×2): qty 1

## 2017-10-21 MED ORDER — GABAPENTIN 300 MG PO CAPS
300.0000 mg | ORAL_CAPSULE | Freq: Two times a day (BID) | ORAL | Status: DC
  Administered 2017-10-21 – 2017-10-22 (×3): 300 mg via ORAL
  Filled 2017-10-21 (×3): qty 1

## 2017-10-21 MED ORDER — TRAZODONE HCL 100 MG PO TABS: 100.0000 mg | ORAL_TABLET | Freq: Every day | ORAL | Status: DC

## 2017-10-21 NOTE — Progress Notes (Signed)
PROGRESS NOTE    Abigail Rhodes  ZOX:[id number] DOB: 13-Apr-1976 DOA: 10/20/2017 PCP: Christ Kick, PA    Brief Narrative:  42 y.o. female, w depression apparently attempted suicide attempt by ingesting 75 pills of benadryl.  Pt apparently had a boyfriend who witnessed a generalized convulsion lasting about 1 minute.      Assessment & Plan:   Principal Problem:   Overdose Active Problems:   Tachycardia   Seizure (HCC)  Intentional Benadryl Overdose , with agitation, suicide attempt -Given Physostigmine 0.5mg  iv x1 at time of admission -Patient presented with prolonged QTc in excess of 500, now trending down -Continue serial EKG -Psychiatry consulted. Recommendation for inpt psych when medically cleared. Not clear at present  Suicide attempt -Per above, psych consulted. Appreciate input -Continue sitter  Tachycardia -Had been sinus this AM -Will follow serial EKG  ? Seizure likely due to overdose -EEG ordered, pending -Seizure free thus far   DVT prophylaxis: SCD's Code Status: Full Family Communication: Pt in room, family not at bedside Disposition Plan: Inpt Psych when medically clear, timing uncertain  Consultants:   Psychiatry  Procedures:     Antimicrobials: Anti-infectives (From admission, onward)   None       Subjective: Confused this AM  Objective: Vitals:   10/21/17 0400 10/21/17 0454 10/21/17 0500 10/21/17 0536  BP: 118/78 (!) 148/104  136/89  Pulse: 99 90  (!) 105  Resp: (!) 22 20  20   Temp:    98 F (36.7 C)  TempSrc:      SpO2: 93% 95%  99%  Weight:   71.6 kg (157 lb 13.6 oz)   Height:        Intake/Output Summary (Last 24 hours) at 10/21/2017 1421 Last data filed at 10/21/2017 1223 Gross per 24 hour  Intake 3540 ml  Output -  Net 3540 ml   Filed Weights   10/20/17 2039 10/21/17 0500  Weight: 70.3 kg (155 lb) 71.6 kg (157 lb 13.6 oz)    Examination:  General exam: Appears calm and comfortable  Respiratory system:  Clear to auscultation. Respiratory effort normal. Cardiovascular system: S1 & S2 heard, RRR Gastrointestinal system: Abdomen is nondistended, soft and nontender. No organomegaly or masses felt. Normal bowel sounds heard. Central nervous system: Alert disoriented. No focal neurological deficits. Extremities: Symmetric 5 x 5 power. Skin: No rashes, lesions Psychiatry:difficult to assess given confusion  Data Reviewed: I have personally reviewed following labs and imaging studies  CBC: Recent Labs  Lab 10/20/17 2043 10/21/17 0442  WBC 17.4* 15.0*  NEUTROABS 16.2*  --   HGB 15.3* 13.2  HCT 42.3 37.2  MCV 91.8 92.8  PLT 364 323   Basic Metabolic Panel: Recent Labs  Lab 10/20/17 2043 10/21/17 0442  NA 138 138  K 3.5 3.3*  CL 110 112*  CO2 19* 19*  GLUCOSE 112* 91  BUN 9 6  CREATININE 0.75 0.62  CALCIUM 8.7* 8.3*  MG  --  2.0   GFR: Estimated Creatinine Clearance: 88.7 mL/min (by C-G formula based on SCr of 0.62 mg/dL). Liver Function Tests: Recent Labs  Lab 10/20/17 2043 10/21/17 0442  AST 21 22  ALT 17 16  ALKPHOS 64 54  BILITOT 0.5 0.3  PROT 7.7 6.5  ALBUMIN 4.3 3.9   No results for input(s): LIPASE, AMYLASE in the last 168 hours. No results for input(s): AMMONIA in the last 168 hours. Coagulation Profile: No results for input(s): INR, PROTIME in the last 168 hours. Cardiac Enzymes:  Recent Labs  Lab 10/21/17 0557  CKTOTAL 558*  CKMB 4.0   BNP (last 3 results) No results for input(s): PROBNP in the last 8760 hours. HbA1C: No results for input(s): HGBA1C in the last 72 hours. CBG: Recent Labs  Lab 10/20/17 2042  GLUCAP 111*   Lipid Profile: No results for input(s): CHOL, HDL, LDLCALC, TRIG, CHOLHDL, LDLDIRECT in the last 72 hours. Thyroid Function Tests: Recent Labs    10/21/17 0557  TSH 1.267   Anemia Panel: No results for input(s): VITAMINB12, FOLATE, FERRITIN, TIBC, IRON, RETICCTPCT in the last 72 hours. Sepsis Labs: No results for  input(s): PROCALCITON, LATICACIDVEN in the last 168 hours.  No results found for this or any previous visit (from the past 240 hour(s)).   Radiology Studies: Ct Head Wo Contrast  Result Date: 10/20/2017 CLINICAL DATA:  Overdose, seizure. EXAM: CT HEAD WITHOUT CONTRAST TECHNIQUE: Contiguous axial images were obtained from the base of the skull through the vertex without intravenous contrast. COMPARISON:  None. FINDINGS: BRAIN: No intraparenchymal hemorrhage, mass effect nor midline shift. The ventricles and sulci are normal. No acute large vascular territory infarcts. No abnormal extra-axial fluid collections. Basal cisterns are patent. Cerebellar tonsils at but not below the foramen magnum. VASCULAR: Unremarkable. SKULL/SOFT TISSUES: No skull fracture. No significant soft tissue swelling. ORBITS/SINUSES: The included ocular globes and orbital contents are normal.The mastoid aircells and included paranasal sinuses are well-aerated. OTHER: None. IMPRESSION: Normal noncontrast CT HEAD. Electronically Signed   By: Awilda Metroourtnay  Bloomer M.D.   On: 10/20/2017 23:58    Scheduled Meds: . gabapentin  300 mg Oral BID  . LORazepam  2 mg Oral BID  . nicotine  14 mg Transdermal Daily   Continuous Infusions: . sodium chloride 75 mL/hr at 10/21/17 0236     LOS: 1 day   Rickey BarbaraStephen Brendalee Matthies, MD [facility] Pager [phone]  If 7PM-7AM, please contact night-coverage [email or web address] Password [email or web address] 10/21/2017, 2:21 PM

## 2017-10-21 NOTE — Consult Note (Addendum)
[facility] Consult   Reason for Consult: suicide attempts, depression Referring Physician:  EDP Patient Identification: Abigail Rhodes MRN:  [id number] Principal Diagnosis: Overdose Diagnosis:   Patient Active Problem List   Diagnosis Date Noted  . Overdose [T50.901A] 10/20/2017  . Tachycardia [R00.0] 10/20/2017  . Seizure ([place]) [R56.9] 10/20/2017    Total Time spent with patient: 45 minutes  Subjective:   Abigail Rhodes is a 42 y.o. female patient admitted after suicide attempt.  HPI:  Patient who reports history of Major depressive disorder, weaned herself off medication about 2 years ago. She was brought to the hospital by  EMS after she intentional overdosed on 75 pills of 25 mg Benadryl. Patient is currently confused with slurred speech and with her permission history was obtained from her adult daughter, Angelica Pou. Daughter reports that patient has confided in her that her boyfriend has asked her to leave and no longer interested in her. She states that they have been arguing a lot. Patient reports worsening depression, stress and being overwhelmed. When she could no longer take she overdosed yesterday in an attempt to kill herself. She denies psychosis, delusions, drug use but reports occasional alcohol use. Patient appears confused, disoriented and has not slept in the last 24 hours.   Past Psychiatric History: MDD  Risk to Self: Is patient at risk for suicide?: Yes Risk to Others:   Prior Inpatient Therapy:   Prior Outpatient Therapy:    Past Medical History:  Past Medical History:  Diagnosis Date  . Depression     Past Surgical History:  Procedure Laterality Date  . FOOT SURGERY    . PARTIAL HYSTERECTOMY    . TONSILLECTOMY    . TUBAL LIGATION     Family History:  Family History  Problem Relation Age of Onset  . Hypertension Father    Family Psychiatric  History:  Social History:  Social History   Substance and Sexual Activity  Alcohol Use Yes    Comment: social     Social History   Substance and Sexual Activity  Drug Use No    Social History   Socioeconomic History  . Marital status: Single    Spouse name: None  . Number of children: None  . Years of education: None  . Highest education level: None  Social Needs  . Financial resource strain: None  . Food insecurity - worry: None  . Food insecurity - inability: None  . Transportation needs - medical: None  . Transportation needs - non-medical: None  Occupational History  . None  Tobacco Use  . Smoking status: Current Every Day Smoker    Types: Cigarettes  . Smokeless tobacco: Never Used  Substance and Sexual Activity  . Alcohol use: Yes    Comment: social  . Drug use: No  . Sexual activity: None  Other Topics Concern  . None  Social History Narrative  . None   Additional Social History:    Allergies:   Allergies  Allergen Reactions  . Pepto-Bismol [Bismuth Subsalicylate] Other (See Comments)    antsy    Labs:  Results for orders placed or performed during the hospital encounter of 10/20/17 (from the past 48 hour(s))  CBG monitoring, ED     Status: Abnormal   Collection Time: 10/20/17  8:42 PM  Result Value Ref Range   Glucose-Capillary 111 (H) 65 - 99 mg/dL   Comment 1 Notify RN    Comment 2 Document in Chart   CBC with  Differential     Status: Abnormal   Collection Time: 10/20/17  8:43 PM  Result Value Ref Range   WBC 17.4 (H) 4.0 - 10.5 K/uL   RBC 4.61 3.87 - 5.11 MIL/uL   Hemoglobin 15.3 (H) 12.0 - 15.0 g/dL   HCT 42.3 36.0 - 46.0 %   MCV 91.8 78.0 - 100.0 fL   MCH 33.2 26.0 - 34.0 pg   MCHC 36.2 (H) 30.0 - 36.0 g/dL   RDW 12.2 11.5 - 15.5 %   Platelets 364 150 - 400 K/uL   Neutrophils Relative % 93 %   Neutro Abs 16.2 (H) 1.7 - 7.7 K/uL   Lymphocytes Relative 6 %   Lymphs Abs 1.0 0.7 - 4.0 K/uL   Monocytes Relative 1 %   Monocytes Absolute 0.2 0.1 - 1.0 K/uL   Eosinophils Relative 0 %   Eosinophils Absolute 0.0 0.0 - 0.7 K/uL    Basophils Relative 0 %   Basophils Absolute 0.0 0.0 - 0.1 K/uL    Comment: Performed at [facility], [place] [address], [place], [place] [address]  Comprehensive metabolic panel     Status: Abnormal   Collection Time: 10/20/17  8:43 PM  Result Value Ref Range   Sodium 138 135 - 145 mmol/L   Potassium 3.5 3.5 - 5.1 mmol/L   Chloride 110 101 - 111 mmol/L   CO2 19 (L) 22 - 32 mmol/L   Glucose, Bld 112 (H) 65 - 99 mg/dL   BUN 9 6 - 20 mg/dL   Creatinine, Ser 0.75 0.44 - 1.00 mg/dL   Calcium 8.7 (L) 8.9 - 10.3 mg/dL   Total Protein 7.7 6.5 - 8.1 g/dL   Albumin 4.3 3.5 - 5.0 g/dL   AST 21 15 - 41 U/L   ALT 17 14 - 54 U/L   Alkaline Phosphatase 64 38 - 126 U/L   Total Bilirubin 0.5 0.3 - 1.2 mg/dL   GFR calc non Af Amer >60 >60 mL/min   GFR calc Af Amer >60 >60 mL/min    Comment: (NOTE) The eGFR has been calculated using the CKD EPI equation. This calculation has not been validated in all clinical situations. eGFR's persistently <60 mL/min signify possible Chronic Kidney Disease.    Anion gap 9 5 - 15    Comment: Performed at [facility], [place] [address] [place], [place] [address]  Acetaminophen level     Status: Abnormal   Collection Time: 10/20/17  8:43 PM  Result Value Ref Range   Acetaminophen (Tylenol), Serum <10 (L) 10 - 30 ug/mL    Comment:        THERAPEUTIC CONCENTRATIONS VARY SIGNIFICANTLY. A RANGE OF 10-30 ug/mL MAY BE AN EFFECTIVE CONCENTRATION FOR MANY PATIENTS. HOWEVER, SOME ARE BEST TREATED AT CONCENTRATIONS OUTSIDE THIS RANGE. ACETAMINOPHEN CONCENTRATIONS >150 ug/mL AT 4 HOURS AFTER INGESTION AND >50 ug/mL AT 12 HOURS AFTER INGESTION ARE OFTEN ASSOCIATED WITH TOXIC REACTIONS. Performed at [facility], [place] [address] [place], [place] [address]   Salicylate level     Status: None   Collection Time: 10/20/17  8:43 PM  Result Value Ref Range   Salicylate Lvl <1.4 2.8 - 30.0 mg/dL    Comment: Performed  at [facility], [place] [address] [place], [place] [address]  Ethanol     Status: None   Collection Time: 10/20/17  8:43 PM  Result Value Ref Range   Alcohol, Ethyl (B) <10 <10 mg/dL  Comment:        LOWEST DETECTABLE LIMIT FOR SERUM ALCOHOL IS 10 mg/dL FOR MEDICAL PURPOSES ONLY Performed at [facility] [address], [place], [place] [address]   Urinalysis, Routine w reflex microscopic     Status: Abnormal   Collection Time: 10/20/17  8:47 PM  Result Value Ref Range   Color, Urine YELLOW YELLOW   APPearance CLEAR CLEAR   Specific Gravity, Urine 1.008 1.005 - 1.030   pH 6.0 5.0 - 8.0   Glucose, UA NEGATIVE NEGATIVE mg/dL   Hgb urine dipstick NEGATIVE NEGATIVE   Bilirubin Urine NEGATIVE NEGATIVE   Ketones, ur NEGATIVE NEGATIVE mg/dL   Protein, ur 30 (A) NEGATIVE mg/dL   Nitrite NEGATIVE NEGATIVE   Leukocytes, UA NEGATIVE NEGATIVE   RBC / HPF 0-5 0 - 5 RBC/hpf   WBC, UA 0-5 0 - 5 WBC/hpf   Bacteria, UA RARE (A) NONE SEEN   Squamous Epithelial / LPF 0-5 (A) NONE SEEN   Mucus PRESENT     Comment: Performed at [facility], [place] [address], [place], [place] [address]  Rapid urine drug screen (hospital performed)     Status: None   Collection Time: 10/20/17  8:47 PM  Result Value Ref Range   Opiates NONE DETECTED NONE DETECTED   Cocaine NONE DETECTED NONE DETECTED   Benzodiazepines NONE DETECTED NONE DETECTED   Amphetamines NONE DETECTED NONE DETECTED   Tetrahydrocannabinol NONE DETECTED NONE DETECTED   Barbiturates NONE DETECTED NONE DETECTED    Comment: (NOTE) DRUG SCREEN FOR MEDICAL PURPOSES ONLY.  IF CONFIRMATION IS NEEDED FOR ANY PURPOSE, NOTIFY LAB WITHIN 5 DAYS. LOWEST DETECTABLE LIMITS FOR URINE DRUG SCREEN Drug Class                     Cutoff (ng/mL) Amphetamine and metabolites    1000 Barbiturate and metabolites    200 Benzodiazepine                 902 Tricyclics and metabolites     300 Opiates and  metabolites        300 Cocaine and metabolites        300 THC                            50 Performed at [facility], [place] [address] [place],  [address]   I-Stat Beta hCG blood, ED ([facility], [facility], [facility] only)     Status: None   Collection Time: 10/20/17  8:48 PM  Result Value Ref Range   I-stat hCG, quantitative <5.0 <5 mIU/mL   Comment 3            Comment:   GEST. AGE      CONC.  (mIU/mL)   <=1 WEEK        5 - 50     2 WEEKS       50 - 500     3 WEEKS       100 - 10,000     4 WEEKS     1,000 - 30,000        FEMALE AND NON-PREGNANT FEMALE:     LESS THAN 5 mIU/mL   Comprehensive metabolic panel     Status: Abnormal   Collection Time: 10/21/17  4:42 AM  Result Value Ref Range   Sodium 138 135 - 145 mmol/L   Potassium 3.3 (L) 3.5 -  5.1 mmol/L   Chloride 112 (H) 101 - 111 mmol/L   CO2 19 (L) 22 - 32 mmol/L   Glucose, Bld 91 65 - 99 mg/dL   BUN 6 6 - 20 mg/dL   Creatinine, Ser 0.62 0.44 - 1.00 mg/dL   Calcium 8.3 (L) 8.9 - 10.3 mg/dL   Total Protein 6.5 6.5 - 8.1 g/dL   Albumin 3.9 3.5 - 5.0 g/dL   AST 22 15 - 41 U/L   ALT 16 14 - 54 U/L   Alkaline Phosphatase 54 38 - 126 U/L   Total Bilirubin 0.3 0.3 - 1.2 mg/dL   GFR calc non Af Amer >60 >60 mL/min   GFR calc Af Amer >60 >60 mL/min    Comment: (NOTE) The eGFR has been calculated using the CKD EPI equation. This calculation has not been validated in all clinical situations. eGFR's persistently <60 mL/min signify possible Chronic Kidney Disease.    Anion gap 7 5 - 15    Comment: Performed at [facility], [place] [address] [place], [place] [address]  CBC     Status: Abnormal   Collection Time: 10/21/17  4:42 AM  Result Value Ref Range   WBC 15.0 (H) 4.0 - 10.5 K/uL   RBC 4.01 3.87 - 5.11 MIL/uL   Hemoglobin 13.2 12.0 - 15.0 g/dL   HCT 37.2 36.0 - 46.0 %   MCV 92.8 78.0 - 100.0 fL   MCH 32.9 26.0 - 34.0 pg   MCHC 35.5 30.0 - 36.0 g/dL   RDW 12.5 11.5 - 15.5 %   Platelets 323 150 -  400 K/uL    Comment: Performed at [facility], [place] [address] [place], [place] [address]  Magnesium     Status: None   Collection Time: 10/21/17  4:42 AM  Result Value Ref Range   Magnesium 2.0 1.7 - 2.4 mg/dL    Comment: Performed at [facility], [place] [address] [place], [place] [address]  TSH     Status: None   Collection Time: 10/21/17  5:57 AM  Result Value Ref Range   TSH 1.267 0.350 - 4.500 uIU/mL    Comment: Performed by a 3rd Generation assay with a functional sensitivity of <=0.01 uIU/mL. Performed at [facility], [place] [address] [place], [place] [address]   CK total and CKMB (cardiac)not at [facility]     Status: Abnormal   Collection Time: 10/21/17  5:57 AM  Result Value Ref Range   Total CK 558 (H) 38 - 234 U/L   CK, MB 4.0 0.5 - 5.0 ng/mL   Relative Index 0.7 0.0 - 2.5    Comment: Performed at [place] [address], [place],  [address]    Current Facility-Administered Medications  Medication Dose Route Frequency Provider Last Rate Last Dose  . 0.9 %  sodium chloride infusion   Intravenous Continuous Jani Gravel, MD 75 mL/hr at 10/21/17 0236    . acetaminophen (TYLENOL) tablet 650 mg  650 mg Oral Q6H PRN Jani Gravel, MD       Or  . acetaminophen (TYLENOL) suppository 650 mg  650 mg Rectal Q6H PRN Jani Gravel, MD      . gabapentin (NEURONTIN) capsule 300 mg  300 mg Oral BID Keisy Strickler, MD      . LORazepam (ATIVAN) injection 2 mg  2 mg Intravenous Q6H PRN Jani Gravel, MD   2 mg at 10/21/17 0606  . LORazepam (  ATIVAN) tablet 2 mg  2 mg Oral BID Corena Pilgrim, MD        Musculoskeletal: Strength & Muscle Tone: within normal limits Gait & Station: unsteady Patient leans: Front  Psychiatric Specialty Exam: Physical Exam  Psychiatric: Her mood appears anxious. Her speech is delayed and slurred. She is slowed and withdrawn. Cognition and memory are normal. She expresses impulsivity. She  exhibits a depressed mood. She expresses suicidal ideation.    Review of Systems  Constitutional: Negative.   HENT: Negative.   Eyes: Negative.   Respiratory: Negative.   Cardiovascular: Negative.   Gastrointestinal: Positive for nausea.  Genitourinary: Negative.   Musculoskeletal: Negative.   Skin: Negative.   Neurological: Positive for tremors.  Endo/Heme/Allergies: Negative.   Psychiatric/Behavioral: Positive for depression and suicidal ideas. The patient is nervous/anxious and has insomnia.     Blood pressure 136/89, pulse (!) 105, temperature 98 F (36.7 C), resp. rate 20, height _0  (1.6 m), weight 71.6 kg (157 lb 13.6 oz), SpO2 99 %.Body mass index is 27.96 kg/m.  General Appearance: Casual  Eye Contact:  Minimal  Speech:  Garbled, Pressured and Slurred  Volume:  Decreased  Mood:  Anxious, Depressed, Dysphoric and Hopeless  Affect:  Constricted  Thought Process:  Disorganized  Orientation:  Other:  only to person and place  Thought Content:  Logical and Rumination  Suicidal Thoughts:  Yes.  without intent/plan  Homicidal Thoughts:  No  Memory:  Immediate;   Fair Recent;   Fair Remote;   Fair  Judgement:  Impaired  Insight:  Shallow  Psychomotor Activity:  Decreased and Psychomotor Retardation  Concentration:  Concentration: Poor and Attention Span: Poor  Recall:  [employer] of Knowledge:  Fair  Language:  Fair  Akathisia:  No  Handed:  Right  AIMS (if indicated):     Assets:  Communication Skills Desire for Improvement Social Support  ADL's:  Intact  Cognition:  WNL  Sleep:   poor     Treatment Plan Summary: Daily contact with patient to assess and evaluate symptoms and progress in treatment and Medication management Diagnosis:  Major depressive disorder-recurrent episode severe  Plan/Recommendations: -Continue 1:1sitter for safety -Ativan 2 mg bid x 48 hours due to severe anxiety/insomnia -Gabapentin 300 mg bid for anxiety/mood/agitation -Monitor  QTC, CK level  Disposition: Recommend psychiatric Inpatient admission when medically cleared.  Corena Pilgrim, MD 10/21/2017 12:08 PM

## 2017-10-21 NOTE — ED Notes (Signed)
Spoke with poison control and suggests getting another EKG 2-3 hours from last. Suggests watching potassium and magnesium levels to be high end of normal due to prolonged QRS. Add CPK lab work.

## 2017-10-21 NOTE — ED Notes (Signed)
ED TO INPATIENT HANDOFF REPORT  Name/Age/Gender Abigail Rhodes 42 y.o. female  Code Status    Code Status Orders  (From admission, onward)        Start     Ordered   10/21/17 0149  Full code  Continuous     10/21/17 0148    Code Status History    Date Active Date Inactive Code Status Order ID Comments User Context   This patient has a current code status but no historical code status.      Home/SNF/Other Home  Chief Complaint Overdose  Level of Care/Admitting Diagnosis ED Disposition    ED Disposition Condition Comment   Admit  Hospital Area: [facility] [[id number]]  Level of Care: Telemetry [5]  Admit to tele based on following criteria: Monitor QTC interval  Diagnosis: Overdose [841660]  Admitting Physician: Jani Gravel [[id number]]  Attending Physician: Jani Gravel [phone]  Estimated length of stay: past midnight tomorrow  Certification:: I certify this patient will need inpatient services for at least 2 midnights  PT Class (Do Not Modify): Inpatient [101]  PT Acc Code (Do Not Modify): Private [1]       Medical History Past Medical History:  Diagnosis Date  . Depression     Allergies Allergies  Allergen Reactions  . Pepto-Bismol [Bismuth Subsalicylate] Other (See Comments)    antsy    IV Location/Drains/Wounds Patient Lines/Drains/Airways Status   Active Line/Drains/Airways    Name:   Placement date:   Placement time:   Site:   Days:   Peripheral IV 10/20/17 Left Hand   10/20/17    2033    Hand   1   Peripheral IV 10/20/17 Right Antecubital   10/20/17    2043    Antecubital   1          Labs/Imaging Results for orders placed or performed during the hospital encounter of 10/20/17 (from the past 48 hour(s))  CBG monitoring, ED     Status: Abnormal   Collection Time: 10/20/17  8:42 PM  Result Value Ref Range   Glucose-Capillary 111 (H) 65 - 99 mg/dL   Comment 1 Notify RN    Comment 2 Document in Chart   CBC with Differential      Status: Abnormal   Collection Time: 10/20/17  8:43 PM  Result Value Ref Range   WBC 17.4 (H) 4.0 - 10.5 K/uL   RBC 4.61 3.87 - 5.11 MIL/uL   Hemoglobin 15.3 (H) 12.0 - 15.0 g/dL   HCT 42.3 36.0 - 46.0 %   MCV 91.8 78.0 - 100.0 fL   MCH 33.2 26.0 - 34.0 pg   MCHC 36.2 (H) 30.0 - 36.0 g/dL   RDW 12.2 11.5 - 15.5 %   Platelets 364 150 - 400 K/uL   Neutrophils Relative % 93 %   Neutro Abs 16.2 (H) 1.7 - 7.7 K/uL   Lymphocytes Relative 6 %   Lymphs Abs 1.0 0.7 - 4.0 K/uL   Monocytes Relative 1 %   Monocytes Absolute 0.2 0.1 - 1.0 K/uL   Eosinophils Relative 0 %   Eosinophils Absolute 0.0 0.0 - 0.7 K/uL   Basophils Relative 0 %   Basophils Absolute 0.0 0.0 - 0.1 K/uL    Comment: Performed at [facility], [place] [address] [place], [place] [address]  Comprehensive metabolic panel     Status: Abnormal   Collection Time: 10/20/17  8:43 PM  Result Value Ref Range   Sodium  138 135 - 145 mmol/L   Potassium 3.5 3.5 - 5.1 mmol/L   Chloride 110 101 - 111 mmol/L   CO2 19 (L) 22 - 32 mmol/L   Glucose, Bld 112 (H) 65 - 99 mg/dL   BUN 9 6 - 20 mg/dL   Creatinine, Ser 0.75 0.44 - 1.00 mg/dL   Calcium 8.7 (L) 8.9 - 10.3 mg/dL   Total Protein 7.7 6.5 - 8.1 g/dL   Albumin 4.3 3.5 - 5.0 g/dL   AST 21 15 - 41 U/L   ALT 17 14 - 54 U/L   Alkaline Phosphatase 64 38 - 126 U/L   Total Bilirubin 0.5 0.3 - 1.2 mg/dL   GFR calc non Af Amer >60 >60 mL/min   GFR calc Af Amer >60 >60 mL/min    Comment: (NOTE) The eGFR has been calculated using the CKD EPI equation. This calculation has not been validated in all clinical situations. eGFR's persistently <60 mL/min signify possible Chronic Kidney Disease.    Anion gap 9 5 - 15    Comment: Performed at [facility], [place] [address] [place], [place] [address]  Acetaminophen level     Status: Abnormal   Collection Time: 10/20/17  8:43 PM  Result Value Ref Range   Acetaminophen (Tylenol), Serum <10 (L) 10 - 30 ug/mL     Comment:        THERAPEUTIC CONCENTRATIONS VARY SIGNIFICANTLY. A RANGE OF 10-30 ug/mL MAY BE AN EFFECTIVE CONCENTRATION FOR MANY PATIENTS. HOWEVER, SOME ARE BEST TREATED AT CONCENTRATIONS OUTSIDE THIS RANGE. ACETAMINOPHEN CONCENTRATIONS >150 ug/mL AT 4 HOURS AFTER INGESTION AND >50 ug/mL AT 12 HOURS AFTER INGESTION ARE OFTEN ASSOCIATED WITH TOXIC REACTIONS. Performed at [facility], [place] [address] [place], [place] [address]   Salicylate level     Status: None   Collection Time: 10/20/17  8:43 PM  Result Value Ref Range   Salicylate Lvl <2.7 2.8 - 30.0 mg/dL    Comment: Performed at [facility], [place] [address] [place], [place] [address]  Ethanol     Status: None   Collection Time: 10/20/17  8:43 PM  Result Value Ref Range   Alcohol, Ethyl (B) <10 <10 mg/dL    Comment:        LOWEST DETECTABLE LIMIT FOR SERUM ALCOHOL IS 10 mg/dL FOR MEDICAL PURPOSES ONLY Performed at [facility], [place] [address] [place], [place] [address]   Urinalysis, Routine w reflex microscopic     Status: Abnormal   Collection Time: 10/20/17  8:47 PM  Result Value Ref Range   Color, Urine YELLOW YELLOW   APPearance CLEAR CLEAR   Specific Gravity, Urine 1.008 1.005 - 1.030   pH 6.0 5.0 - 8.0   Glucose, UA NEGATIVE NEGATIVE mg/dL   Hgb urine dipstick NEGATIVE NEGATIVE   Bilirubin Urine NEGATIVE NEGATIVE   Ketones, ur NEGATIVE NEGATIVE mg/dL   Protein, ur 30 (A) NEGATIVE mg/dL   Nitrite NEGATIVE NEGATIVE   Leukocytes, UA NEGATIVE NEGATIVE   RBC / HPF 0-5 0 - 5 RBC/hpf   WBC, UA 0-5 0 - 5 WBC/hpf   Bacteria, UA RARE (A) NONE SEEN   Squamous Epithelial / LPF 0-5 (A) NONE SEEN   Mucus PRESENT     Comment: Performed at [facility], [place] [address], [place], [place] [address]  Rapid urine drug screen (hospital performed)     Status: None   Collection Time: 10/20/17  8:47 PM  Result  Value Ref Range   Opiates NONE DETECTED  NONE DETECTED   Cocaine NONE DETECTED NONE DETECTED   Benzodiazepines NONE DETECTED NONE DETECTED   Amphetamines NONE DETECTED NONE DETECTED   Tetrahydrocannabinol NONE DETECTED NONE DETECTED   Barbiturates NONE DETECTED NONE DETECTED    Comment: (NOTE) DRUG SCREEN FOR MEDICAL PURPOSES ONLY.  IF CONFIRMATION IS NEEDED FOR ANY PURPOSE, NOTIFY LAB WITHIN 5 DAYS. LOWEST DETECTABLE LIMITS FOR URINE DRUG SCREEN Drug Class                     Cutoff (ng/mL) Amphetamine and metabolites    1000 Barbiturate and metabolites    200 Benzodiazepine                 373 Tricyclics and metabolites     300 Opiates and metabolites        300 Cocaine and metabolites        300 THC                            50 Performed at [facility], [place] [address] [place], [place] [address]   I-Stat Beta hCG blood, ED ([facility], [facility], [facility] only)     Status: None   Collection Time: 10/20/17  8:48 PM  Result Value Ref Range   I-stat hCG, quantitative <5.0 <5 mIU/mL   Comment 3            Comment:   GEST. AGE      CONC.  (mIU/mL)   <=1 WEEK        5 - 50     2 WEEKS       50 - 500     3 WEEKS       100 - 10,000     4 WEEKS     1,000 - 30,000        FEMALE AND NON-PREGNANT FEMALE:     LESS THAN 5 mIU/mL    Ct Head Wo Contrast  Result Date: 10/20/2017 CLINICAL DATA:  Overdose, seizure. EXAM: CT HEAD WITHOUT CONTRAST TECHNIQUE: Contiguous axial images were obtained from the base of the skull through the vertex without intravenous contrast. COMPARISON:  None. FINDINGS: BRAIN: No intraparenchymal hemorrhage, mass effect nor midline shift. The ventricles and sulci are normal. No acute large vascular territory infarcts. No abnormal extra-axial fluid collections. Basal cisterns are patent. Cerebellar tonsils at but not below the foramen magnum. VASCULAR: Unremarkable. SKULL/SOFT TISSUES: No skull fracture. No significant soft tissue swelling. ORBITS/SINUSES: The included ocular globes and orbital contents are  normal.The mastoid aircells and included paranasal sinuses are well-aerated. OTHER: None. IMPRESSION: Normal noncontrast CT HEAD. Electronically Signed   By: Elon Alas M.D.   On: 10/20/2017 23:58    Pending Labs Unresulted Labs (From admission, onward)   Start     Ordered   10/21/17 0500  Comprehensive metabolic panel  Tomorrow morning,   R     10/21/17 0148   10/21/17 0500  CBC  Tomorrow morning,   R     10/21/17 0148   10/21/17 0500  TSH  Tomorrow morning,   R     10/21/17 0148   10/21/17 0500  HIV antibody (Routine Testing)  Once,   R     10/21/17 0209   10/21/17 0500  Magnesium  Tomorrow morning,   R     10/21/17 0421   10/21/17 0500  CK total  and CKMB (cardiac)not at [place] morning,   R     10/21/17 0422      Vitals/Pain Today's Vitals   10/21/17 0245 10/21/17 0300 10/21/17 0345 10/21/17 0400  BP: (!) 147/78 (!) 126/94 (!) 141/84 118/78  Pulse: (!) 104 (!) 102 (!) 102 99  Resp: (!) 22 (!) 24 (!) 28 (!) 22  Temp:      TempSrc:      SpO2: 96% 94% 96% 93%  Weight:      Height:        Isolation Precautions No active isolations  Medications Medications  LORazepam (ATIVAN) injection 2 mg (not administered)  0.9 %  sodium chloride infusion ( Intravenous New Bag/Given 10/21/17 0236)  acetaminophen (TYLENOL) tablet 650 mg (not administered)    Or  acetaminophen (TYLENOL) suppository 650 mg (not administered)  sodium chloride 0.9 % bolus 1,000 mL (0 mLs Intravenous Stopped 10/20/17 2144)  LORazepam (ATIVAN) injection 1 mg (1 mg Intravenous Given 10/20/17 2051)  sodium chloride 0.9 % bolus 1,000 mL (0 mLs Intravenous Stopped 10/20/17 2241)  0.9 %  sodium chloride infusion ( Intravenous Stopped 10/21/17 0425)  LORazepam (ATIVAN) injection 1 mg (1 mg Intravenous Given 10/20/17 2311)  physostigmine salicylate (ANTILIRIUM) injection 0.5 mg (0.5 mg Intravenous Given 10/21/17 0049)  LORazepam (ATIVAN) injection 1 mg (1 mg Intravenous Given 10/21/17 0235)    Mobility walks

## 2017-10-22 ENCOUNTER — Other Ambulatory Visit: Payer: Self-pay

## 2017-10-22 ENCOUNTER — Inpatient Hospital Stay (HOSPITAL_COMMUNITY): Payer: 59

## 2017-10-22 ENCOUNTER — Inpatient Hospital Stay
Admission: AD | Admit: 2017-10-22 | Discharge: 2017-10-25 | DRG: 881 | Disposition: A | Discharge status: Home / Self Care | Payer: 59 | Source: Intra-hospital | Attending: Psychiatry | Admitting: Psychiatry

## 2017-10-22 ENCOUNTER — Encounter: Payer: Self-pay | Admitting: Psychiatry

## 2017-10-22 ENCOUNTER — Inpatient Hospital Stay (HOSPITAL_COMMUNITY)
Admit: 2017-10-22 | Discharge: 2017-10-22 | Disposition: A | Discharge status: Home / Self Care | Payer: 59 | Attending: Internal Medicine | Admitting: Internal Medicine

## 2017-10-22 DIAGNOSIS — R946 Abnormal results of thyroid function studies: Secondary | ICD-10-CM | POA: Diagnosis present

## 2017-10-22 DIAGNOSIS — F4310 Post-traumatic stress disorder, unspecified: Secondary | ICD-10-CM

## 2017-10-22 DIAGNOSIS — Z79899 Other long term (current) drug therapy: Secondary | ICD-10-CM

## 2017-10-22 DIAGNOSIS — F329 Major depressive disorder, single episode, unspecified: Secondary | ICD-10-CM | POA: Diagnosis present

## 2017-10-22 DIAGNOSIS — R569 Unspecified convulsions: Secondary | ICD-10-CM

## 2017-10-22 DIAGNOSIS — T50901A Poisoning by unspecified drugs, medicaments and biological substances, accidental (unintentional), initial encounter: Secondary | ICD-10-CM | POA: Diagnosis present

## 2017-10-22 DIAGNOSIS — F17210 Nicotine dependence, cigarettes, uncomplicated: Secondary | ICD-10-CM | POA: Diagnosis present

## 2017-10-22 DIAGNOSIS — F17200 Nicotine dependence, unspecified, uncomplicated: Secondary | ICD-10-CM | POA: Diagnosis present

## 2017-10-22 DIAGNOSIS — Z888 Allergy status to other drugs, medicaments and biological substances status: Secondary | ICD-10-CM

## 2017-10-22 LAB — COMPREHENSIVE METABOLIC PANEL
ALBUMIN: 3.4 g/dL — AB (ref 3.5–5.0)
ALK PHOS: 50 U/L (ref 38–126)
ALT: 16 U/L (ref 14–54)
AST: 18 U/L (ref 15–41)
Anion gap: 5 (ref 5–15)
BILIRUBIN TOTAL: 0.7 mg/dL (ref 0.3–1.2)
BUN: 9 mg/dL (ref 6–20)
CALCIUM: 8.2 mg/dL — AB (ref 8.9–10.3)
CO2: 25 mmol/L (ref 22–32)
Chloride: 109 mmol/L (ref 101–111)
Creatinine, Ser: 0.7 mg/dL (ref 0.44–1.00)
GFR calc Af Amer: >60 mL/min (ref >60)
GFR calc non Af Amer: >60 mL/min (ref >60)
GLUCOSE: 93 mg/dL (ref 65–99)
Potassium: 3.3 mmol/L — ABNORMAL LOW (ref 3.5–5.1)
Sodium: 139 mmol/L (ref 135–145)
TOTAL PROTEIN: 5.9 g/dL — AB (ref 6.5–8.1)

## 2017-10-22 LAB — CBC
HCT: 37.2 % (ref 36.0–46.0)
Hemoglobin: 13 g/dL (ref 12.0–15.0)
MCH: 32.7 pg (ref 26.0–34.0)
MCHC: 34.9 g/dL (ref 30.0–36.0)
MCV: 93.5 fL (ref 78.0–100.0)
Platelets: 284 10*3/uL (ref 150–400)
RBC: 3.98 MIL/uL (ref 3.87–5.11)
RDW: 12.6 % (ref 11.5–15.5)
WBC: 9 10*3/uL (ref 4.0–10.5)

## 2017-10-22 MED ORDER — CLONAZEPAM 1 MG PO TABS
2.0000 mg | ORAL_TABLET | Freq: Two times a day (BID) | ORAL | Status: DC
  Administered 2017-10-22 – 2017-10-23 (×2): 2 mg via ORAL
  Filled 2017-10-22 (×2): qty 2

## 2017-10-22 MED ORDER — ACETAMINOPHEN 325 MG PO TABS
650.0000 mg | ORAL_TABLET | Freq: Four times a day (QID) | ORAL | Status: DC | PRN
  Administered 2017-10-23: 650 mg via ORAL
  Filled 2017-10-22: qty 2

## 2017-10-22 MED ORDER — NICOTINE 21 MG/24HR TD PT24
21.0000 mg | MEDICATED_PATCH | Freq: Every day | TRANSDERMAL | Status: DC
  Administered 2017-10-23 – 2017-10-25 (×3): 21 mg via TRANSDERMAL
  Filled 2017-10-22 (×3): qty 1

## 2017-10-22 MED ORDER — MAGNESIUM HYDROXIDE 400 MG/5ML PO SUSP: 30.0000 mL | Freq: Every day | ORAL | Status: DC | PRN

## 2017-10-22 MED ORDER — ALUM & MAG HYDROXIDE-SIMETH 200-200-20 MG/5ML PO SUSP: 30.0000 mL | ORAL | Status: DC | PRN

## 2017-10-22 MED ORDER — SERTRALINE HCL 25 MG PO TABS
50.0000 mg | ORAL_TABLET | Freq: Every day | ORAL | Status: DC
  Administered 2017-10-22 – 2017-10-23 (×2): 50 mg via ORAL
  Filled 2017-10-22 (×2): qty 2

## 2017-10-22 MED ORDER — GABAPENTIN 300 MG PO CAPS: 300.0000 mg | ORAL_CAPSULE | Freq: Two times a day (BID) | ORAL | 0 refills | Status: DC

## 2017-10-22 MED ORDER — POTASSIUM CHLORIDE CRYS ER 20 MEQ PO TBCR
40.0000 meq | EXTENDED_RELEASE_TABLET | Freq: Once | ORAL | Status: AC
  Administered 2017-10-22: 40 meq via ORAL
  Filled 2017-10-22: qty 2

## 2017-10-22 MED ORDER — LORAZEPAM 2 MG PO TABS: 2.0000 mg | ORAL_TABLET | Freq: Two times a day (BID) | ORAL | 0 refills | Status: DC

## 2017-10-22 MED ORDER — TRAZODONE HCL 100 MG PO TABS
100.0000 mg | ORAL_TABLET | Freq: Every day | ORAL | Status: DC
  Administered 2017-10-22: 100 mg via ORAL
  Filled 2017-10-22: qty 1

## 2017-10-22 NOTE — Progress Notes (Signed)
Report given to Tyawn at [facility]. [facility] scheduled for 1730 to transfer patient. Will continue to monitor patient.

## 2017-10-22 NOTE — Discharge Summary (Signed)
Physician Discharge Summary  Sheffield Slidermanda S Webb ZOX:[id number] DOB: [birth year] DOA: 10/20/2017  PCP: Christ KickErvin, Alison M, PA  Admit date: 10/20/2017 Discharge date: 10/22/2017  Admitted From: Home Disposition:  Inpatient Behavioral Health  Recommendations for Outpatient Follow-up:  1. When discharged, would follow up with PCP in 1-2 weeks  Discharge Condition:Improved CODE STATUS:Full Diet recommendation: Regular   Brief/Interim Summary: 42 y.o.female,w depression apparently attempted suicide attempt by ingesting 75 pills of benadryl. Pt apparently had a boyfriend who witnessed a generalized convulsion lasting about 1 minute.  Intentional Benadryl Overdose , with agitation, suicide attempt -Given Physostigmine 0.5mg  iv x1 at time of admission -Patient presented with prolonged QTc in excess of 500, trended down to normal range on serial EKG's -Remained in NSR on tele, since d/c'd -EEG reviewed, unremarkable -Electrolytes replaced -Psychiatry consulted. Recommendation for inpt psych when medically cleared. Patient is now medically clear for inpatient behavioral health  Suicide attempt -Per above, psych consulted. Appreciate input  Tachycardia -Had been sinus this AM -Normalized, NSR on tele. Repeat EKG normalized  ? Seizure likely due to overdose -EEG ordered, reviewed with normal findings -Seizure free this admit -Recommend no driving x 6 months or when cleared by PCP when discharged    Discharge Diagnoses:  Principal Problem:   Overdose Active Problems:   Tachycardia   Seizure [facility](HCC)    Discharge Instructions   Allergies as of 10/22/2017      Reactions   Pepto-bismol [bismuth Subsalicylate] Other (See Comments)   antsy      Medication List    STOP taking these medications   PREVIDENT 5000 DRY MOUTH 1.1 % Gel dental gel Generic drug:  sodium fluoride   sertraline 25 MG tablet Commonly known as:  ZOLOFT     TAKE these medications   gabapentin 300 MG  capsule Commonly known as:  NEURONTIN Take 1 capsule (300 mg total) by mouth 2 (two) times daily.   LORazepam 2 MG tablet Commonly known as:  ATIVAN Take 1 tablet (2 mg total) by mouth 2 (two) times daily.       Allergies  Allergen Reactions  . Pepto-Bismol [Bismuth Subsalicylate] Other (See Comments)    antsy    Consultations:  Psychiatry  Procedures/Studies: Ct Head Wo Contrast  Result Date: 10/20/2017 CLINICAL DATA:  Overdose, seizure. EXAM: CT HEAD WITHOUT CONTRAST TECHNIQUE: Contiguous axial images were obtained from the base of the skull through the vertex without intravenous contrast. COMPARISON:  None. FINDINGS: BRAIN: No intraparenchymal hemorrhage, mass effect nor midline shift. The ventricles and sulci are normal. No acute large vascular territory infarcts. No abnormal extra-axial fluid collections. Basal cisterns are patent. Cerebellar tonsils at but not below the foramen magnum. VASCULAR: Unremarkable. SKULL/SOFT TISSUES: No skull fracture. No significant soft tissue swelling. ORBITS/SINUSES: The included ocular globes and orbital contents are normal.The mastoid aircells and included paranasal sinuses are well-aerated. OTHER: None. IMPRESSION: Normal noncontrast CT HEAD. Electronically Signed   By: Awilda Metroourtnay  Bloomer M.D.   On: 10/20/2017 23:58   Mr Brain Wo Contrast  Result Date: 10/22/2017 CLINICAL DATA:  Seizure, new and nontraumatic. Attempted suicide with Benadryl. EXAM: MRI HEAD WITHOUT CONTRAST TECHNIQUE: Multiplanar, multiecho pulse sequences of the brain and surrounding structures were obtained without intravenous contrast. COMPARISON:  Head CT from 2 days ago FINDINGS: Brain: No infarction, hemorrhage, hydrocephalus, extra-axial collection or mass lesion. No signal abnormality throughout the brain. Normal brain volume. Vascular: Normal flow voids. Skull and upper cervical spine: Negative Sinuses/Orbits: Negative IMPRESSION: Normal brain MRI. Electronically Signed  By:  Marnee Spring M.D.   On: 10/22/2017 06:41    Subjective: More awake, no acute distress  Discharge Exam: Vitals:   10/22/17 0640 10/22/17 1508  BP: 114/63 119/76  Pulse: 82 91  Resp:  18  Temp: 98.1 F (36.7 C) 98.4 F (36.9 C)  SpO2: 98% 98%   Vitals:   10/21/17 1422 10/21/17 2226 10/22/17 0640 10/22/17 1508  BP: 123/86 110/75 114/63 119/76  Pulse: 89 79 82 91  Resp: 18   18  Temp: 98.1 F (36.7 C) 98.7 F (37.1 C) 98.1 F (36.7 C) 98.4 F (36.9 C)  TempSrc: Oral Oral Oral Oral  SpO2: 100% 95% 98% 98%  Weight:      Height:        General: Pt is alert, awake, not in acute distress Cardiovascular: RRR, S1/S2 +, no rubs, no gallops Respiratory: CTA bilaterally, no wheezing, no rhonchi Abdominal: Soft, NT, ND, bowel sounds + Extremities: no edema, no cyanosis   The results of significant diagnostics from this hospitalization (including imaging, microbiology, ancillary and laboratory) are listed below for reference.     Microbiology: No results found for this or any previous visit (from the past 240 hour(s)).   Labs: BNP (last 3 results) No results for input(s): BNP in the last 8760 hours. Basic Metabolic Panel: Recent Labs  Lab 10/20/17 2043 10/21/17 0442 10/22/17 0934  NA 138 138 139  K 3.5 3.3* 3.3*  CL 110 112* 109  CO2 19* 19* 25  GLUCOSE 112* 91 93  BUN 9 6 9   CREATININE 0.75 0.62 0.70  CALCIUM 8.7* 8.3* 8.2*  MG  --  2.0  --    Liver Function Tests: Recent Labs  Lab 10/20/17 2043 10/21/17 0442 10/22/17 0934  AST 21 22 18   ALT 17 16 16   ALKPHOS 64 54 50  BILITOT 0.5 0.3 0.7  PROT 7.7 6.5 5.9*  ALBUMIN 4.3 3.9 3.4*   No results for input(s): LIPASE, AMYLASE in the last 168 hours. No results for input(s): AMMONIA in the last 168 hours. CBC: Recent Labs  Lab 10/20/17 2043 10/21/17 0442 10/22/17 0934  WBC 17.4* 15.0* 9.0  NEUTROABS 16.2*  --   --   HGB 15.3* 13.2 13.0  HCT 42.3 37.2 37.2  MCV 91.8 92.8 93.5  PLT 364 323 284    Cardiac Enzymes: Recent Labs  Lab 10/21/17 0557  CKTOTAL 558*  CKMB 4.0   BNP: Invalid input(s): POCBNP CBG: Recent Labs  Lab 10/20/17 2042  GLUCAP 111*   D-Dimer No results for input(s): DDIMER in the last 72 hours. Hgb A1c No results for input(s): HGBA1C in the last 72 hours. Lipid Profile No results for input(s): CHOL, HDL, LDLCALC, TRIG, CHOLHDL, LDLDIRECT in the last 72 hours. Thyroid function studies Recent Labs    10/21/17 0557  TSH 1.267   Anemia work up No results for input(s): VITAMINB12, FOLATE, FERRITIN, TIBC, IRON, RETICCTPCT in the last 72 hours. Urinalysis    Component Value Date/Time   COLORURINE YELLOW 10/20/2017 2047   APPEARANCEUR CLEAR 10/20/2017 2047   LABSPEC 1.008 10/20/2017 2047   PHURINE 6.0 10/20/2017 2047   GLUCOSEU NEGATIVE 10/20/2017 2047   HGBUR NEGATIVE 10/20/2017 2047   BILIRUBINUR NEGATIVE 10/20/2017 2047   KETONESUR NEGATIVE 10/20/2017 2047   PROTEINUR 30 (A) 10/20/2017 2047   NITRITE NEGATIVE 10/20/2017 2047   LEUKOCYTESUR NEGATIVE 10/20/2017 2047   Sepsis Labs Invalid input(s): PROCALCITONIN,  WBC,  LACTICIDVEN Microbiology No results found for this or  any previous visit (from the past 240 hour(s)).   SIGNED:   Rickey Barbara, MD  [facility] 10/22/2017, 4:22 PM  If 7PM-7AM, please contact night-coverage [email or web address] Password [email or web address]

## 2017-10-22 NOTE — Progress Notes (Signed)
Taken in a wheelchair for MRI of brain. Pt has slept well all night , no agitation.

## 2017-10-22 NOTE — Clinical Social Work Note (Signed)
Clinical Social Work Assessment  Patient Details  Name: Abigail Rhodes MRN: [id number] Date of Birth: 01-28-1976  Date of referral:  10/22/17               Reason for consult:  Facility Placement                Permission sought to share information with:  Family Supports, Customer service manager, Case Optician, dispensing granted to share information::  Yes, Verbal Permission Granted  Name::     Engineer, manufacturing systems::  Inpatient facility  Relationship::  Boyfriend  Contact Information:     Housing/Transportation Living arrangements for the past 2 months:  Single Family Home Source of Information:  Patient Patient Interpreter Needed:  None Criminal Activity/Legal Involvement Pertinent to Current Situation/Hospitalization:  No - Comment as needed Significant Relationships:  Dependent Children, Friend, Significant Other Lives with:  Significant Other Do you feel safe going back to the place where you live?  Yes Need for family participation in patient care:  Yes (Comment)   Care giving concerns:  Patient reports that she currently lives with her boyfriend, who wants her out the house.    Social Worker assessment / plan:  LCSW following for inpatient psych.  Patient admitted for intentional OD.  LCSW met at bedside with patient. No family present, but patient has a Actuary for safety.  Patient was tearful and sad at time of assessment.   Patient reports that she works full time and lives with her boyfriend. She reports that he is her soon to be ex-boyfriend and that he wants her out of the house.   Patient reports that she works at [employer] full time. She is originally from [place] and has lived in [place] for the past 4 years. Patient reports that she has 2 children that live in [place] with their father. Patient has no other family in [place]. Patient has a friend that visited her in the hospital that she considers a support.   Patient reports that she was on meds in the past  when she lived in [place] and was seen by an outpatient provider. Patient states she is not currently on any medication.   PLAN: Patient will go inpatient when medically stable. Patient is voluntary at the time of assessment.   Employment status:  [employer] information:  Managed Care PT Recommendations:  Not assessed at this time Information / Referral to community resources:  Inpatient Psychiatric Care (Comment Required)  Patient/Family's Response to care:  Unable to access.   Patient/Family's Understanding of and Emotional Response to Diagnosis, Current Treatment, and Prognosis:  Patient is understanding of current diagnosis and agreeable to treatment plan.   Emotional Assessment Appearance:  Appears stated age Attitude/Demeanor/Rapport:    Affect (typically observed):  Tearful/Crying, Sad, Calm, Depressed Orientation:  Oriented to Self, Oriented to Place, Oriented to  Time, Oriented to Situation Alcohol / Substance use:  Not Applicable Psych involvement (Current and /or in the community):  No (Comment)  Discharge Needs  Concerns to be addressed:  No discharge needs identified Readmission within the last 30 days:  No Current discharge risk:  None Barriers to Discharge:  Continued Medical Work up   [employer], LCSW 10/22/2017, 1:15 PM

## 2017-10-22 NOTE — BH Assessment (Addendum)
Patient has been accepted to [facility].  Patient assigned to room 322 Accepting physician is Dr. Jennet MaduroPucilowska Attending physician is Dr. Johnella MoloneyMcNew  Call report to [phone].  Representative was [employer] PepsiBernadette  Patient's Family/Support System Elijio Miles(William Engelhard boyfriend- [phone]) was contacted, however unable to leave a message.

## 2017-10-22 NOTE — Progress Notes (Addendum)
LCSW following for inpatient psych placement.  [facility] pending EEG results. Room 322  Attending MD: McNew  Patient will transport by [facility].  [facility] called at 4:[phone]. Transport arranged for 5:30.   RN report #: [id number]  Coralyn HellingBernette Saraiya Kozma, Norberta KeensLSCW [place] CSW [phone]

## 2017-10-22 NOTE — Progress Notes (Signed)
EEG completed, results pending. 

## 2017-10-22 NOTE — Progress Notes (Signed)
Breakfast tray order

## 2017-10-22 NOTE — Progress Notes (Signed)
42 year old female admitted to unit. Alert and oriented x4 with a steady gait. Currently denies SI/HI/AVH and pain at present time. Patient reports that current stressors are the fact that she and her boyfriend are having problems, not really having a place of her own, (although she has family that she can stay with) and "dealing with my depression all of the time". Patient states that she has a seizure a few days ago after she overdosed on the Benadryl's that she took, no seizure activity since. EEG was normal per nurse giving [facility] today [facility]. Oriented patient to room and unit. Skin and contraband search completed and witnessed by [facility] F, MHT. Multiple tattoos to body observed, skin otherwise warm, dry and intact. No contraband found. Admission assessment completed, fluid and nutrition offered. Patient remains safe on the unit with q 15 minute checks.

## 2017-10-22 NOTE — Tx Team (Signed)
Initial Treatment Plan 10/22/2017 6:37 PM Abigail Rhodes [id number]    PATIENT STRESSORS: Loss of relationship with boyfriend Marital or family conflict   PATIENT STRENGTHS: Capable of independent living Financial means Physical Health Supportive family/friends   PATIENT IDENTIFIED PROBLEMS: Depression  Overdose                   DISCHARGE CRITERIA:  Adequate post-discharge living arrangements Improved stabilization in mood, thinking, and/or behavior  PRELIMINARY DISCHARGE PLAN: Outpatient therapy  PATIENT/FAMILY INVOLVEMENT: This treatment plan has been presented to and reviewed with the patient, Abigail Rhodes, and/or family member.  The patient and family have been given the opportunity to ask questions and make suggestions.  Jim DesanctisYawn L Calloway Andrus, RN 10/22/2017, 6:37 PM

## 2017-10-22 NOTE — Plan of Care (Signed)
Goals not met at this time, patient newly admitted 

## 2017-10-22 NOTE — Care Management Note (Signed)
Case Management Note  Patient Details  Name: Abigail Rhodes MRN: [id number] Date of Birth: [birth year]  Subjective/Objective:                   overdose  Action/Plan: Date: October 22, 2017 Marcelle SmilingRhonda Davis, BSN, [place]3, [place] [phone] Chart and notes review for patient progress and needs. Will follow for case management and discharge needs. No cm or discharge needs present at time of this review. Next review date: [id number]  Expected Discharge Date:                  Expected Discharge Plan:  Home/Self Care  In-House Referral:     Discharge planning Services  CM Consult  Post Acute Care Choice:    Choice offered to:     DME Arranged:    DME Agency:     HH Arranged:    HH Agency:     Status of Service:  In process, will continue to follow  If discussed at Long Length of Stay Meetings, dates discussed:    Additional Comments:  Golda AcreDavis, Rhonda Lynn, RN 10/22/2017, 9:19 AM

## 2017-10-22 NOTE — Procedures (Signed)
ELECTROENCEPHALOGRAM REPORT  Date of Study: 10/22/2017  Patient's Name: Abigail Rhodes MRN: [id number] Date of Birth: 1975-12-09  Referring Provider: Dr. Pearson GrippeJames Kim  Clinical History: This is a 42 year old woman s/p seizure.  Medications: No AEDs listed  Technical Summary: A multichannel digital EEG recording measured by the international 10-20 system with electrodes applied with paste and impedances below 5000 ohms performed in our laboratory with EKG monitoring in an awake and drowsy patient.  Hyperventilation and photic stimulation were not performed.  The digital EEG was referentially recorded, reformatted, and digitally filtered in a variety of bipolar and referential montages for optimal display.    Description: The patient is awake and drowsy during the recording.  During maximal wakefulness, there is a symmetric, medium voltage 10 Hz posterior dominant rhythm that attenuates with eye opening.  The record is symmetric.  There is an excess amount of diffuse low voltage beta activity seen throughout the recording. During drowsiness, there is an increase in theta slowing of the background.Deeper stages of sleep were not seen.  Hyperventilation and photic stimulation were not performed. There were no epileptiform discharges or electrographic seizures seen.    EKG lead showed rare extrasystolic beats.  Impression: This awake and drowsy EEG is normal except for excess amount of diffuse low voltage beta activity.  Clinical Correlation: Diffuse low voltage beta activity is commonly seen with sedating medications such as benzodiazepines.  In the absence of sedating medications, anxiety and hyperthyroidism may produce generalized beta activity.  The absence of epileptiform discharges does not exclude a clinical diagnosis of epilepsy. Clinical correlation is advised.   [name], M.D.

## 2017-10-23 DIAGNOSIS — F329 Major depressive disorder, single episode, unspecified: Principal | ICD-10-CM

## 2017-10-23 DIAGNOSIS — F4310 Post-traumatic stress disorder, unspecified: Secondary | ICD-10-CM

## 2017-10-23 LAB — LIPID PANEL
CHOLESTEROL: 125 mg/dL (ref 0–200)
HDL: 47 mg/dL (ref >40)
LDL CALC: 67 mg/dL (ref 0–99)
Total CHOL/HDL Ratio: 2.7 RATIO
Triglycerides: 55 mg/dL (ref <150)
VLDL: 11 mg/dL (ref 0–40)

## 2017-10-23 LAB — TSH: TSH: 0.283 u[IU]/mL — ABNORMAL LOW (ref 0.350–4.500)

## 2017-10-23 LAB — HEMOGLOBIN A1C
HEMOGLOBIN A1C: 5.2 % (ref 4.8–5.6)
MEAN PLASMA GLUCOSE: 102.54 mg/dL

## 2017-10-23 MED ORDER — HYDROXYZINE HCL 50 MG PO TABS
50.0000 mg | ORAL_TABLET | Freq: Three times a day (TID) | ORAL | Status: DC | PRN
  Administered 2017-10-23 – 2017-10-24 (×2): 50 mg via ORAL
  Filled 2017-10-23 (×2): qty 1

## 2017-10-23 MED ORDER — GABAPENTIN 300 MG PO CAPS
300.0000 mg | ORAL_CAPSULE | Freq: Two times a day (BID) | ORAL | Status: DC
  Administered 2017-10-23 – 2017-10-25 (×4): 300 mg via ORAL
  Filled 2017-10-23 (×4): qty 1

## 2017-10-23 MED ORDER — TRAZODONE HCL 100 MG PO TABS
100.0000 mg | ORAL_TABLET | Freq: Every evening | ORAL | Status: DC | PRN
  Administered 2017-10-24: 100 mg via ORAL
  Filled 2017-10-23: qty 1

## 2017-10-23 MED ORDER — CLONAZEPAM 0.5 MG PO TABS: 0.5000 mg | ORAL_TABLET | Freq: Two times a day (BID) | ORAL | Status: DC

## 2017-10-23 MED ORDER — LAMOTRIGINE 25 MG PO TABS
25.0000 mg | ORAL_TABLET | Freq: Every day | ORAL | Status: DC
  Administered 2017-10-23 – 2017-10-25 (×3): 25 mg via ORAL
  Filled 2017-10-23 (×3): qty 1
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
## 2017-10-23 NOTE — Progress Notes (Signed)
Patient is A & O x 4 . Denies any SI/HI at this time. No acute distress noted.  Refused to attend group meetings. Will continue to monitor camera surveillance and 15 minutes checks.

## 2017-10-23 NOTE — Plan of Care (Signed)
Patient slept for Estimated Hours of 7.15; Precautionary checks every 15 minutes for safety maintained, room free of safety hazards, patient sustains no injury or falls during this shift.  

## 2017-10-23 NOTE — [facility] Group Notes (Signed)
[facility] Group Notes:  (Nursing/MHT/Case Management/Adjunct)  Date:  10/23/2017  Time:  3:42 PM  Type of Therapy:  Psychoeducational Skills  Participation Level:  Active  Participation Quality:  Appropriate  Affect:  Flat and Irritable  Cognitive:  Appropriate  Insight:  Appropriate  Engagement in Group:  Limited  Modes of Intervention:  Discussion and Education  Summary of Progress/Problems:  Abigail PleasureKemiya L Yazen Rhodes 10/23/2017, 3:42 PM
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
## 2017-10-23 NOTE — [facility] Suicide Risk Assessment (Signed)
[facility]   Nursing information obtained from:    Demographic factors:   single, caucasion femal Current Mental Status:   depressed Loss Factors:   loss of housing Historical Factors:   multiple overdose attempts Risk Reduction Factors:     Total Time spent with patient: 1 hour Principal Problem: Major depressive disorder, single episode with mixed features Diagnosis:   Patient Active Problem List   Diagnosis Date Noted  . PTSD (post-traumatic stress disorder) [F43.10] 10/23/2017  . Major depressive disorder, single episode with mixed features [F32.9] 10/23/2017  . Tobacco use disorder [F17.200] 10/22/2017  . Overdose [T50.901A] 10/20/2017  . Tachycardia [R00.0] 10/20/2017  . Seizure (HCC) [R56.9] 10/20/2017   Subjective Data: See H&P  Continued Clinical Symptoms:  Alcohol Use Disorder Identification Test Final Score (AUDIT): 1 The "Alcohol Use Disorders Identification Test", Guidelines for Use in Primary Care, Second Edition.  World Science writerHealth Organization [facility](WHO). Score between 0-7:  no or low risk or alcohol related problems. Score between 8-15:  moderate risk of alcohol related problems. Score between 16-19:  high risk of alcohol related problems. Score 20 or above:  warrants further diagnostic evaluation for alcohol dependence and treatment.   CLINICAL FACTORS:   Depression:   Hopelessness    COGNITIVE FEATURES THAT CONTRIBUTE TO RISK:  None    SUICIDE RISK:   Moderate:  Frequent suicidal ideation with limited intensity, and duration, some specificity in terms of plans, no associated intent, good self-control, limited dysphoria/symptomatology, some risk factors present, and identifiable protective factors, including available and accessible social support. Chronic risk due to cluster B traits,poor coping skills, impulsive overdose attempts  PLAN OF CARE: See H&P  I certify that inpatient services furnished can reasonably be expected to improve  the patient's condition.   Haskell RilingHolly R Debara Kamphuis, MD 10/23/2017, 3:52 PM

## 2017-10-23 NOTE — [facility] Group Notes (Signed)
10/23/2017 9:30AM  Type of Therapy/Topic:  Group Therapy:  Feelings about Diagnosis  Participation Level:  Did Not Attend   Description of Group:   This group will allow patients to explore their thoughts and feelings about diagnoses they have received. Patients will be guided to explore their level of understanding and acceptance of these diagnoses. Facilitator will encourage patients to process their thoughts and feelings about the reactions of others to their diagnosis and will guide patients in identifying ways to discuss their diagnosis with significant others in their lives. This group will be process-oriented, with patients participating in exploration of their own experiences, giving and receiving support, and processing challenge from other group members.   Therapeutic Goals: 1. Patient will demonstrate understanding of diagnosis as evidenced by identifying two or more symptoms of the disorder 2. Patient will be able to express two feelings regarding the diagnosis 3. Patient will demonstrate their ability to communicate their needs through discussion and/or role play  Summary of Patient Progress: Patient was encouraged and invited to attend group. Patient did not attend group. Social worker will continue to encourage group participation in the future.        Therapeutic Modalities:   Cognitive Behavioral Therapy Brief Therapy Feelings Identification    Johny ShearsCassandra  Feather Berrie, LCSW 10/23/2017 10:09 AM

## 2017-10-23 NOTE — [facility] Group Notes (Signed)
LCSW Group Therapy Note 10/23/2017 9:00am  Type of Therapy and Topic:  Group Therapy:  Setting Goals  Participation Level:  Did Not Attend  Description of Group: In this process group, patients discussed using strengths to work toward goals and address challenges.  Patients identified two positive things about themselves and one goal they were working on.  Patients were given the opportunity to share openly and support each other's plan for self-empowerment.  The group discussed the value of gratitude and were encouraged to have a daily reflection of positive characteristics or circumstances.  Patients were encouraged to identify a plan to utilize their strengths to work on current challenges and goals.  Therapeutic Goals 1. Patient will verbalize personal strengths/positive qualities and relate how these can assist with achieving desired personal goals 2. Patients will verbalize affirmation of peers plans for personal change and goal setting 3. Patients will explore the value of gratitude and positive focus as related to successful achievement of goals 4. Patients will verbalize a plan for regular reinforcement of personal positive qualities and circumstances.  Summary of Patient Progress:       Therapeutic Modalities Cognitive Behavioral Therapy Motivational Interviewing    Skye Rodarte P Zarius Furr, LCSW 10/23/2017 5:42 PM   

## 2017-10-23 NOTE — Progress Notes (Signed)
Recreation Therapy Notes  INPATIENT RECREATION THERAPY ASSESSMENT  Patient Details Name: Abigail Rhodes MRN: [id number] DOB: [birth year] Today's Date: 10/23/2017       Information Obtained From: Patient  Able to Participate in Assessment/Interview: Yes  Patient Presentation: Responsive  Reason for Admission (Per Patient): Suicide Attempt  Patient Stressors: Relationship  Coping Skills:   Music, Other (Comment)(Clean)  Leisure Interests (2+):  Music - Listen(Watch live bands, drive)  Frequency of Recreation/Participation: Weekly  Awareness of Community Resources:  No  Community Resources:     Current Use:    If no, Barriers?:    Expressed Interest in [employer] CorporationCommunity Resource Information: No  Patient Main Form of Transportation: Car  Patient Strengths:  Strong, I have lost of energy, empathetic  Patient Identified Areas of Improvement:  Not leyying things get to me.   Current Recreation Participation:  N/A  Patient Goal for Hospitalization:  To get out  [place]  County [place] Residence:  [place]  Current SI (including self-harm):  No  Current HI:  No  Current AVH: No  Staff Intervention Plan: Group Attendance, Collaborate with Interdisciplinary Treatment Team  Consent to Intern Participation: N/A  Roxy Mastandrea 10/23/2017, 4:20 PM

## 2017-10-23 NOTE — Progress Notes (Signed)
Patient ID: Abigail Rhodes, female   DOB: [birth year], 42 y.o.   MRN: [id number] Pleasant, bright, seemed to be lost looking for her room, Re-Oriented to her new environment and anticipated course of care discussed; no seizure activities, upbeat, "my boyfriend kicks me out every 6 months and this time around, I couldn't just take it, but I don't feel like hurting myself now..."

## 2017-10-23 NOTE — Progress Notes (Signed)
Recreation Therapy Notes  Date: 02.05.2019  Time: 1:00 PM  Location: Craft Room  Behavioral response: Appropriate   Intervention Topic: Stress  Discussion/Intervention: Group content on today was focused on stress. The group defined stress and way to cope with stress. Participants expressed how they know when they are stresses out. Individuals described the different ways they have to cope with stress. The group stated reasons why it is important to cope with stress. Patient explained what good stress is and some examples. The group participated in the intervention "Stress Management". Individuals were separated into two group and answered questions related to stress.  Clinical Observations/Feedback: Patient came to group and identified working on a promotion at work is a positive stressor. She explained that the process of getting a promotion at work would be stressful but getting the promotion in the end would be a positive outcome. Individual described stress as something she tries to avoid. Patient stated that when she is stressed sometimes she turns to drinking. She participated in the intervention and was social with peers and staff during group.  [place]ami Latulippe LRT/CTRS         Trenace Coughlin 10/23/2017 3:02 PM

## 2017-10-23 NOTE — [facility] Suicide Risk Assessment (Signed)
[facility] INPATIENT:  Family/Significant Other Suicide Prevention Education  Suicide Prevention Education:  Education Completed; Donnamarie RossettiBrian Weihe, Patients ex-boyfriend, [phone] has been identified by the patient as the family member/significant other with whom the patient will be residing, and identified as the person(s) who will aid the patient in the event of a mental health crisis (suicidal ideations/suicide attempt).  With written consent from the patient, the family member/significant other has been provided the following suicide prevention education, prior to the and/or following the discharge of the patient.  The suicide prevention education provided includes the following:  Suicide risk factors  Suicide prevention and interventions  National Suicide Hotline telephone number  [facility] assessment telephone number  [facility] Emergency Assistance 911  [facility] and/or Residential [facility] Crisis [facility] telephone number  Request made of family/significant other to:  Remove weapons (e.g., guns, rifles, knives), all items previously/currently identified as safety concern.    Remove drugs/medications (over-the-counter, prescriptions, illicit drugs), all items previously/currently identified as a safety concern.  The family member/significant other verbalizes understanding of the suicide prevention education information provided.  The family member/significant other agrees to remove the items of safety concern listed above.  Arlys JohnBrian states that he is working with the patients father to get all of her items removed from the home. He is working with her father to get something lined up for her to move into for when she is discharged. Ex-boyfriend says that he does not feel safe if she moves back in the home. Ex-boyfriend says that he is willing to get a restraining order. He says that he removed all of the firearms of the house last year. He is also receiving threats from  her family because of what she did. Due to those reasons, he moved all of the weapons back into the home but she will not returning back there. He reports that all of her belongings will be in storage of at her fathers home when she is released. Ex-boyfriend wants a warning before she is released.     Johny ShearsCassandra  Diezel Mazur 10/23/2017, 11:50 AM

## 2017-10-24 MED ORDER — LAMOTRIGINE 25 MG PO TABS: 25.0000 mg | ORAL_TABLET | Freq: Every day | ORAL | 0 refills | Status: DC

## 2017-10-24 MED ORDER — TRAZODONE HCL 100 MG PO TABS: 100.0000 mg | ORAL_TABLET | Freq: Every evening | ORAL | 0 refills | Status: AC | PRN

## 2017-10-24 MED ORDER — GABAPENTIN 300 MG PO CAPS: 300.0000 mg | ORAL_CAPSULE | Freq: Two times a day (BID) | ORAL | 0 refills | Status: DC

## 2017-10-24 NOTE — Progress Notes (Signed)
Recreation Therapy Notes  Date: 02.06.2019  Time: 1:00 PM  Location: Craft Room  Behavioral response: Appropriate   Intervention Topic: Creative expressions  Discussion/Intervention: Group content on today was focused on creative expressions. The group defined creative expressions and ways they use creative expressions. Individual identified other positive ways creative expressions can be used and why it is important to express yourself. Patients participated in the intervention "butterfly origami", where they had a chance to creatively express themselves. Clinical Observations/Feedback: Patient came to group and stated creative expressions is a way to become vocal about your emotions. She identified dancing as a way she creatively expresses herself. Individual explained in the past some negative ways she expressed herself was screaming,hitting, profanity and breaking things. Patient described that it is important to express yourself to let things out and not keep it bottled up. She was social with peers while participating in the intervention.    Ayza Ripoll LRT/CTRS         Jodi Criscuolo 10/24/2017 2:34 PM

## 2017-10-24 NOTE — Progress Notes (Signed)
Recreation Therapy Notes   Date: 02.06.2019  Time: 3:00pm  Location: Craft room  Behavioral response: Lethargic   Group Type: Craft  Participation level: None  Communication: Patient was not social with peers and staff during group.    Comments: Patient came to group late due to unknown reasons.  Alann Avey LRT/CTRS            Neenah Canter 10/24/2017 4:38 PM

## 2017-10-24 NOTE — Progress Notes (Signed)
[facility] MD Progress Note  10/24/2017 3:36 PM Abigail Rhodes  MRN:  [id number] Subjective:  Pt states that she is really wanting to discharge. She states that she has a job and is missing too much work and this is making her stressed out. She was told that she was unable to return to her boyfriends house and she states that she already knew that. She is upset about it but "I'm trying to not let that bother me." She states that she is going to stay with either her father or aunt. She denies SI or any thoughts of self harm. Denies HI or any thoughts of harming others, especially her boyfriend. Denies AH. VH. She still feels depressed but is really wanting to discharge to get back to work. She has been attending groups on the milieu. She plans to move back to [facility] in the future due to her work and friends being there and asks about follow up if that happens.    Principal Problem: Major depressive disorder, single episode with mixed features Diagnosis:   Patient Active Problem List   Diagnosis Date Noted  . PTSD (post-traumatic stress disorder) [F43.10] 10/23/2017  . Major depressive disorder, single episode with mixed features [F32.9] 10/23/2017  . Tobacco use disorder [F17.200] 10/22/2017  . Overdose [T50.901A] 10/20/2017  . Tachycardia [R00.0] 10/20/2017  . Seizure (HCC) [R56.9] 10/20/2017   Total Time spent with patient: 20 minutes  Past Psychiatric History: See H7P  Past Medical History:  Past Medical History:  Diagnosis Date  . Depression     Past Surgical History:  Procedure Laterality Date  . FOOT SURGERY    . PARTIAL HYSTERECTOMY    . TONSILLECTOMY    . TUBAL LIGATION     Family History:  Family History  Problem Relation Age of Onset  . Hypertension Father    Family Psychiatric  History: See H&P Social History:  Social History   Substance and Sexual Activity  Alcohol Use Yes   Comment: social     Social History   Substance and Sexual Activity  Drug Use No     Social History   Socioeconomic History  . Marital status: Single    Spouse name: None  . Number of children: None  . Years of education: None  . Highest education level: None  Social Needs  . Financial resource strain: None  . Food insecurity - worry: None  . Food insecurity - inability: None  . Transportation needs - medical: None  . Transportation needs - non-medical: None  Occupational History  . None  Tobacco Use  . Smoking status: Current Every Day Smoker    Packs/day: 1.50    Types: Cigarettes  . Smokeless tobacco: Never Used  Substance and Sexual Activity  . Alcohol use: Yes    Comment: social  . Drug use: No  . Sexual activity: None  Other Topics Concern  . None  Social History Narrative  . None   Additional Social History:                         Sleep: Good  Appetite:  Fair  Current Medications: Current Facility-Administered Medications  Medication Dose Route Frequency Provider Last Rate Last Dose  . acetaminophen (TYLENOL) tablet 650 mg  650 mg Oral Q6H PRN Pucilowska, Jolanta B, MD   650 mg at 10/23/17 1958  . alum & mag hydroxide-simeth (MAALOX/MYLANTA) 200-200-20 MG/5ML suspension 30 mL  30 mL Oral Q4H  PRN Pucilowska, Jolanta B, MD      . gabapentin (NEURONTIN) capsule 300 mg  300 mg Oral BID Lenord Fralix R, MD   300 mg at 10/24/17 0806  . hydrOXYzine (ATARAX/VISTARIL) tablet 50 mg  50 mg Oral TID PRN Haskell Riling, MD   50 mg at 10/23/17 1958  . lamoTRIgine (LAMICTAL) tablet 25 mg  25 mg Oral Daily Sandip Power, Ileene Hutchinson, MD   25 mg at 10/24/17 0806  . magnesium hydroxide (MILK OF MAGNESIA) suspension 30 mL  30 mL Oral Daily PRN Pucilowska, Jolanta B, MD      . nicotine (NICODERM CQ - dosed in mg/24 hours) patch 21 mg  21 mg Transdermal Daily Pucilowska, Jolanta B, MD   21 mg at 10/24/17 0804  . traZODone (DESYREL) tablet 100 mg  100 mg Oral QHS PRN Kelechi Astarita, Ileene Hutchinson, MD        Lab Results:  Results for orders placed or performed during the  hospital encounter of 10/22/17 (from the past 48 hour(s))  Hemoglobin A1c     Status: None   Collection Time: 10/23/17  7:25 AM  Result Value Ref Range   Hgb A1c MFr Bld 5.2 4.8 - 5.6 %    Comment: (NOTE) Pre diabetes:          5.7%-6.4% Diabetes:              >6.4% Glycemic control for   <7.0% adults with diabetes    Mean Plasma Glucose 102.54 mg/dL    Comment: Performed at [facility], [address] [place], [place] [address]  Lipid panel     Status: None   Collection Time: 10/23/17  7:25 AM  Result Value Ref Range   Cholesterol 125 0 - 200 mg/dL   Triglycerides 55 <604 mg/dL   HDL 47 >54 mg/dL   Total CHOL/HDL Ratio 2.7 RATIO   VLDL 11 0 - 40 mg/dL   LDL Cholesterol 67 0 - 99 mg/dL    Comment:        Total Cholesterol/HDL:CHD Risk Coronary Heart Disease Risk Table                     Men   Women  1/2 Average Risk   3.4   3.3  Average Risk       5.0   4.4  2 X Average Risk   9.6   7.1  3 X Average Risk  23.4   11.0        Use the calculated Patient Ratio above and the CHD Risk Table to determine the patient's CHD Risk.        ATP III CLASSIFICATION (LDL):  <100     mg/dL   Optimal  098-119  mg/dL   Near or Above                    Optimal  130-159  mg/dL   Borderline  147-829  mg/dL   High  >562     mg/dL   Very High Performed at [facility], [address] [place], [place] [address]   TSH     Status: Abnormal   Collection Time: 10/23/17  7:25 AM  Result Value Ref Range   TSH 0.283 (L) 0.350 - 4.500 uIU/mL    Comment: Performed by a 3rd Generation assay with a functional sensitivity of <=0.01 uIU/mL. Performed at [facility], [address], [place], [place] [address]  Blood Alcohol level:  Lab Results  Component Value Date   ETH <10 10/20/2017    Metabolic Disorder Labs: Lab Results  Component Value Date   HGBA1C 5.2 10/23/2017   MPG 102.54 10/23/2017   No results found for: PROLACTIN Lab Results  Component Value  Date   CHOL 125 10/23/2017   TRIG 55 10/23/2017   HDL 47 10/23/2017   CHOLHDL 2.7 10/23/2017   VLDL 11 10/23/2017   LDLCALC 67 10/23/2017    Physical Findings: AIMS: Facial and Oral Movements Muscles of Facial Expression: None, normal Lips and Perioral Area: None, normal Jaw: None, normal Tongue: None, normal,Extremity Movements Upper (arms, wrists, hands, fingers): None, normal Lower (legs, knees, ankles, toes): None, normal, Trunk Movements Neck, shoulders, hips: None, normal, Overall Severity Severity of abnormal movements (highest score from questions above): None, normal Incapacitation due to abnormal movements: None, normal Patient's awareness of abnormal movements (rate only patient's report): No Awareness, Dental Status Current problems with teeth and/or dentures?: No Does patient usually wear dentures?: No  CIWA:    COWS:     Musculoskeletal: Strength & Muscle Tone: within normal limits Gait & Station: normal Patient leans: N/A  Psychiatric Specialty Exam: Physical Exam  Nursing note and vitals reviewed.   Review of Systems  Skin: Negative for rash.    Blood pressure 121/88, pulse 79, temperature 98 F (36.7 C), temperature source Oral, resp. rate 16, height 5\' 3"  (1.6 m), weight 71.2 kg (157 lb), SpO2 99 %.Body mass index is 27.81 kg/m.  General Appearance: Casual  Eye Contact:  Good  Speech:  Clear and Coherent  Volume:  Normal  Mood:  Depressed  Affect:  Congruent  Thought Process:  Coherent and Goal Directed  Orientation:  Full (Time, Place, and Person)  Thought Content:  Logical  Suicidal Thoughts:  No  Homicidal Thoughts:  No  Memory:  Immediate;   Fair  Judgement:  Fair  Insight:  Fair  Psychomotor Activity:  Normal  Concentration:  Concentration: Fair  Recall:  F[employer]Fair  Fund of Knowledge:  Fair  Language:  Fair  Akathisia:  No      Assets:  Communication Skills Desire for Improvement Resilience  ADL's:  Intact  Cognition:  WNL  Sleep:   Number of Hours: 7.15     Treatment Plan Summary: 42 yo female admitted after overdose as a suicide attempt in response of being kicked out of her boyfriends house. She has been consistently denying SI and states that it was an impulsive attempt. She has been participating in groups. 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 She is very concerned about losing her job. She asks appropriate questions about her medications and plans to continue them.   Plan:  MDD/PTSD -Continue Lamictal 25 mg daily  Dispo -Pt will stay with father or aunt on discharge. Plan for likely discharge tomorrow. She will follow up with [facility].   Haskell RilingHolly R Jimi Schappert, MD 10/24/2017, 3:36 PM

## 2017-10-24 NOTE — [facility] Group Notes (Signed)
  10/24/2017  Time:   Type of Therapy/Topic:  Group Therapy:  Emotion Regulation  Participation Level:  Active   Description of Group:    The purpose of this group is to assist patients in learning to regulate negative emotions and experience positive emotions. Patients will be guided to discuss ways in which they have been vulnerable to their negative emotions. These vulnerabilities will be juxtaposed with experiences of positive emotions or situations, and patients will be challenged to use positive emotions to combat negative ones. Special emphasis will be placed on coping with negative emotions in conflict situations, and patients will process healthy conflict resolution skills.  Therapeutic Goals: 1. Patient will identify two positive emotions or experiences to reflect on in order to balance out negative emotions 2. Patient will label two or more emotions that they find the most difficult to experience 3. Patient will demonstrate positive conflict resolution skills through discussion and/or role plays  Summary of Patient Progress: Pt continues to work towards their tx goals but has not yet reached them. Pt was able to appropriately participate in group discussion, and was able to offer support/validation to other group members. Pt reported she is feeling, "worse today than I did before because I feel like this is dragging on and on and I want to get back to my job." Pt reported she is able to manage her emotions appropriately, except for anger, "I go zero to sixty in one second--and then people just need to leave me alone." Pt reported two ways she can stay calm in the moment are, "planning ahead for what could happen and thinking of something funny."   Therapeutic Modalities:   Cognitive Behavioral Therapy Feelings Identification Dialectical Behavioral Therapy  Heidi DachKelsey Saniah Schroeter, MSW, LCSW 10/24/2017 10:21 AM

## 2017-10-24 NOTE — Plan of Care (Signed)
Patient slept for Estimated Hours of 7.15; Precautionary checks every 15 minutes for safety maintained, room free of safety hazards, patient sustains no injury or falls during this shift.  

## 2017-10-24 NOTE — [facility] Group Notes (Signed)
[facility] Group Notes:  (Nursing/MHT/Case Management/Adjunct)  Date:  10/24/2017  Time:  11:11 PM  Type of Therapy:  Group Therapy  Participation Level:  Active  Participation Quality:  Appropriate  Affect:  Appropriate  Cognitive:  Alert  Insight:  Appropriate  Engagement in Group:  Engaged  Modes of Intervention:  Support  Summary of Progress/Problems:  Mayra NeerJackie L Micca Matura 10/24/2017, 11:11 PM

## 2017-10-24 NOTE — Progress Notes (Signed)
Patient is pleasant without any issues. Attended some group meetings. No c/o pain or any acute respiratory distress noted.  Will continue camera surveillance and 15 minutes checks.

## 2017-10-24 NOTE — [facility] Group Notes (Signed)
[facility] Group Notes:  (Nursing/MHT/Case Management/Adjunct)  Date:  10/24/2017  Time:  3:12 PM  Type of Therapy:  Psychoeducational Skills  Participation Level:  Did Not Attend     Summary of Progress/Problems:  Abigail PleasureKemiya L Messiah Rhodes 10/24/2017, 3:12 PM

## 2017-10-24 NOTE — [facility] Group Notes (Signed)
[facility] Group Notes:  (Nursing/MHT/Case Management/Adjunct)  Date:  10/24/2017  Time:  6:50 AM  Type of Therapy:  Psychoeducational Skills  Participation Level:  Active  Participation Quality:  Appropriate  Affect:  Appropriate  Cognitive:  Appropriate  Insight:  Appropriate  Engagement in Group:  Engaged  Modes of Intervention:  Discussion, Socialization and Support  Summary of Progress/Problems:  Abigail MilroyLaquanda Y Abiageal Rhodes 10/24/2017, 10:33 PM

## 2017-10-24 NOTE — Progress Notes (Signed)
Patient ID: Abigail Rhodes, female   DOB: 03/15/1976, 42 y.o.   MRN: [id number] Continues to improve in mood and affect, pleasant, neat, interacting well with peers and staffs, attended the wrap-up group, denied SI/HI/AVH.
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
## 2017-10-25 NOTE — [facility] Group Notes (Signed)
LCSW Group Therapy Note 10/25/2017 9:00 AM  Type of Therapy and Topic:  Group Therapy:  Setting Goals  Participation Level:  Active  Description of Group: In this process group, patients discussed using strengths to work toward goals and address challenges.  Patients identified two positive things about themselves and one goal they were working on.  Patients were given the opportunity to share openly and support each other's plan for self-empowerment.  The group discussed the value of gratitude and were encouraged to have a daily reflection of positive characteristics or circumstances.  Patients were encouraged to identify a plan to utilize their strengths to work on current challenges and goals.  Therapeutic Goals 1. Patient will verbalize personal strengths/positive qualities and relate how these can assist with achieving desired personal goals 2. Patients will verbalize affirmation of peers plans for personal change and goal setting 3. Patients will explore the value of gratitude and positive focus as related to successful achievement of goals 4. Patients will verbalize a plan for regular reinforcement of personal positive qualities and circumstances.  Summary of Patient Progress: Marchelle Folksmanda actively participated in today's goals group.  Marchelle Folksmanda was able to give some examples of what developing SMART goals meant to her as well as some goals that he has developed that she has been successful in achieving and some that she has not been successful in achieving.  Marchelle Folksmanda shared that her goal for today is "get out of here and get my shoes, a bra, and a cigarette".      Therapeutic Modalities Cognitive Behavioral Therapy Motivational Interviewing    Alease FrameSonya S Amias Hutchinson, [place] 10/25/2017 10:15 AM

## 2017-10-25 NOTE — Plan of Care (Signed)
Patient slept for Estimated Hours of 6.45; Precautionary checks every 15 minutes for safety maintained, room free of safety hazards, patient sustains no injury or falls during this shift.  

## 2017-10-25 NOTE — Progress Notes (Signed)
Recreation Therapy Notes  INPATIENT RECREATION TR PLAN  Patient Details Name: Abigail Rhodes MRN: [id number] DOB: 11/08/1975 Today's Date: 10/25/2017  Rec Therapy Plan Is patient appropriate for Therapeutic Recreation?: Yes Treatment times per week: at least 3 Estimated Length of Stay: 5-7 days TR Treatment/Interventions: Group participation (Comment)  Discharge Criteria Pt will be discharged from therapy if:: Discharged Treatment plan/goals/alternatives discussed and agreed upon by:: Patient/family  Discharge Summary Short term goals set: Patient will identify 3 positive coping skills strategies to use for SI post d/c within 5 recreation therapy group sessions Short term goals met: Adequate for discharge Progress toward goals comments: Groups attended Which groups?: Other (Comment), Stress management(Creative Expressions) Reason goals not met: N/A Therapeutic equipment acquired: N/A Reason patient discharged from therapy: Discharge from hospital Pt/family agrees with progress & goals achieved: Yes Date patient discharged from therapy: 10/25/17   Shaverence  Outlaw 10/25/2017, 3:34 PM  
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
## 2017-10-25 NOTE — Progress Notes (Signed)
Patient is pleasant and cooperative.  Bright affect.  Denies depression SI/HI/VH. Discharge instructions given, verbalized understanding.  Prescriptions given and personal belongings returned.  Escorted off unit by this Clinical research associatewriter to meet father to travel home.

## 2017-10-25 NOTE — Progress Notes (Signed)
Patient ID: Abigail Rhodes, female   DOB: 1976/01/02, 42 y.o.   MRN: [id number] Improved mood and affect, "I received a lot of phone calls today, a lot of support, I am ready to get out of here.Marland Kitchen.Marland Kitchen."Pleasant, logical, coherent, denied SI/SIB/HI/AVH. Anticipating discharge in the morning.

## 2017-10-25 NOTE — Progress Notes (Signed)
  [facility] :  Will you be returning to the same living situation after discharge:  No. At discharge, do you have transportation home?: Yes,  Father or Aunt will come pick up the patient.  Do you have the ability to pay for your medications: Yes,  Insurance  Release of information consent forms completed and in the chart;  Patient's signature needed at discharge.  Patient to Follow up at: Follow-up Information    [facility].   Why:  Please attend your follow up appointment scheduled for Monday 10/29/2017 at 9:45AM. Please bring ID, [employer]urance Card, Proof of household income and Social Sercurity Card. Contact information: [address] [place] [address] [phone]           Next level of care provider [facility]:no  Safety Planning and Suicide Prevention discussed: Yes,   Donnamarie RossettiBrian Monforte, Patients ex-boyfriend, [phone]) [phone]   Have you used any form of tobacco in the last 30 days? (Cigarettes, Smokeless Tobacco, Cigars, and/or Pipes): Yes  Has patient been referred to the Quitline?: Patient refused referral  Patient has been referred for addiction treatment: N/A  Johny ShearsCassandra  Stepan Verrette, LCSW 10/25/2017, 9:13 AM
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
## 2017-10-25 NOTE — BHH Group Notes (Signed)
10/25/2017 9:30AM  Type of Therapy/Topic:  Group Therapy:  Balance in Life  Participation Level:  Active  Description of Group:   This group will address the concept of balance and how it feels and looks when one is unbalanced. Patients will be encouraged to process areas in their lives that are out of balance and identify reasons for remaining unbalanced. Facilitators will guide patients in utilizing problem-solving interventions to address and correct the stressor making their life unbalanced. Understanding and applying boundaries will be explored and addressed for obtaining and maintaining a balanced life. Patients will be encouraged to explore ways to assertively make their unbalanced needs known to significant others in their lives, using other group members and facilitator for support and feedback.  Therapeutic Goals: 1. Patient will identify two or more emotions or situations they have that consume much of in their lives. 2. Patient will identify signs/triggers that life has become out of balance:  3. Patient will identify two ways to set boundaries in order to achieve balance in their lives:  4. Patient will demonstrate ability to communicate their needs through discussion and/or role plays  Summary of Patient Progress: Actively and appropriately engaged in the group. Patient was able to provide support and validation to other group members.Patient practiced active listening when interacting with the facilitator and other group members Patient in still in the process of obtaining treatment goals. Abigail Rhodes spoke about how her job has consumed much of her life and has caused her to become stressed. She says "I need work work on getting more time to myself."       Therapeutic Modalities:   Engineer, manufacturing systemsCognitive Behavioral Therapy Solution-Focused Therapy Assertiveness Training  Abigail Rhodes  Adriyanna Christians, [place]

## 2017-10-25 NOTE — Progress Notes (Signed)
Recreation Therapy Notes  Date: 02.07.2019  Time: 1:00 PM  Location: Craft Room  Behavioral response: N/A  Intervention Topic: Problem Solving   Discussion/Intervention: Patient did not attend group. Clinical Observations/Feedback: Patient did not attend group. Tajae Rybicki LRT/CTRS         Darcy Barbara 10/25/2017 1:47 PM

## 2017-10-25 NOTE — BHH Suicide Risk Assessment (Signed)
[facility]   Principal Problem: Major depressive disorder, single episode with mixed features Discharge Diagnoses:  Patient Active Problem List   Diagnosis Date Noted  . PTSD (post-traumatic stress disorder) [F43.10] 10/23/2017  . Major depressive disorder, single episode with mixed features [F32.9] 10/23/2017  . Tobacco use disorder [F17.200] 10/22/2017  . Overdose [T50.901A] 10/20/2017  . Tachycardia [R00.0] 10/20/2017  . Seizure (HCC) [R56.9] 10/20/2017      Mental Status Per Nursing Assessment::   On Admission:     Demographic Factors:  Caucasian  Loss Factors: Loss of significant relationship  Historical Factors: Prior suicide attempts and Impulsivity  Risk Reduction Factors:   Positive social support and Positive therapeutic relationship  Continued Clinical Symptoms:  Depression:   Impulsivity  Cognitive Features That Contribute To Risk:  None    Suicide Risk:  Mild Acute Risk: No identifiable suicidal ideation.  Patients presenting withrisk factors ; may be classified as minimal risk based on the severity of the depressive symptoms. She is at Chronic elevated risk due to stress with relationship and impulsively overdosing in response to stress.   [facility] Information    [facility] on [facility]2017.   Why:  Please attend your follow up appointment scheduled for Monday 10/29/2017 at 9:45AM. Please bring ID, [employer] Card, Proof of household income and Social Sercurity Card. Contact information: [address] [place] [address] [phone]           Plan Of Care/Follow-up recommendations: [facility]  Dayanis Bergquist R Dandrea Medders, MD 10/25/2017, 8:08 AM

## 2018-01-09 ENCOUNTER — Ambulatory Visit
Admission: RE | Admit: 2018-01-09 | Discharge: 2018-01-09 | Disposition: A | Discharge status: Home / Self Care | Payer: 59 | Source: Ambulatory Visit | Attending: *Deleted | Admitting: *Deleted

## 2018-01-09 DIAGNOSIS — N6320 Unspecified lump in the left breast, unspecified quadrant: Secondary | ICD-10-CM

## 2019-03-31 ENCOUNTER — Other Ambulatory Visit: Payer: Self-pay | Admitting: Family Medicine

## 2019-03-31 ENCOUNTER — Other Ambulatory Visit: Payer: Self-pay | Admitting: *Deleted

## 2019-03-31 DIAGNOSIS — N6320 Unspecified lump in the left breast, unspecified quadrant: Secondary | ICD-10-CM

## 2019-04-11 ENCOUNTER — Other Ambulatory Visit: Payer: Self-pay | Admitting: *Deleted

## 2019-04-14 ENCOUNTER — Other Ambulatory Visit: Payer: Self-pay | Admitting: *Deleted

## 2019-04-14 ENCOUNTER — Ambulatory Visit
Admission: RE | Admit: 2019-04-14 | Discharge: 2019-04-14 | Disposition: A | Discharge status: Home / Self Care | Payer: BC Managed Care – PPO | Source: Ambulatory Visit | Attending: Family Medicine | Admitting: Family Medicine

## 2019-04-14 ENCOUNTER — Other Ambulatory Visit: Payer: Self-pay

## 2019-04-14 DIAGNOSIS — N6320 Unspecified lump in the left breast, unspecified quadrant: Secondary | ICD-10-CM

## 2019-12-15 IMAGING — MG DIGITAL DIAGNOSTIC BILATERAL MAMMOGRAM WITH TOMO AND CAD
6 of 10 series · 6 of 26 positions shown · non-contrast
Comparison: Previous exam(s).

CLINICAL DATA: Short-term follow-up for a probably benign mass in
the left breast, probably benign calcifications in the left breast
and a probably benign area of asymmetry in the right breast. These
were initially assessed in December 2016.

EXAM:
DIGITAL DIAGNOSTIC BILATERAL MAMMOGRAM WITH CAD AND TOMO
ULTRASOUND LEFT BREAST

[L ML]
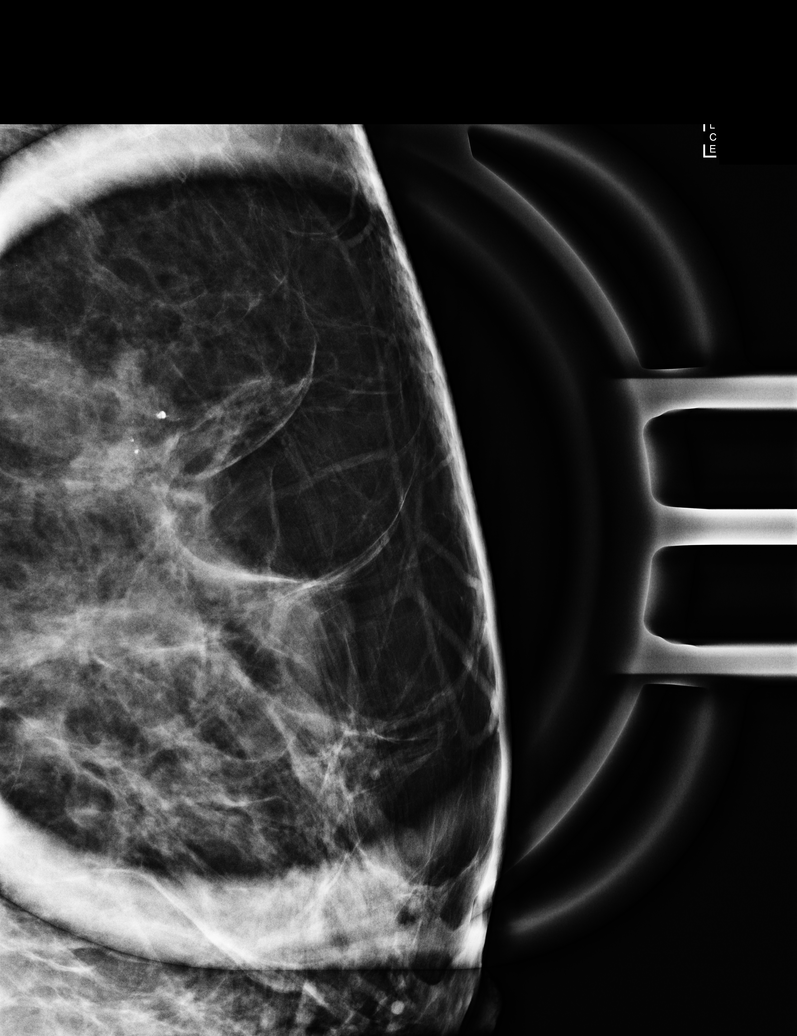

[L CC]
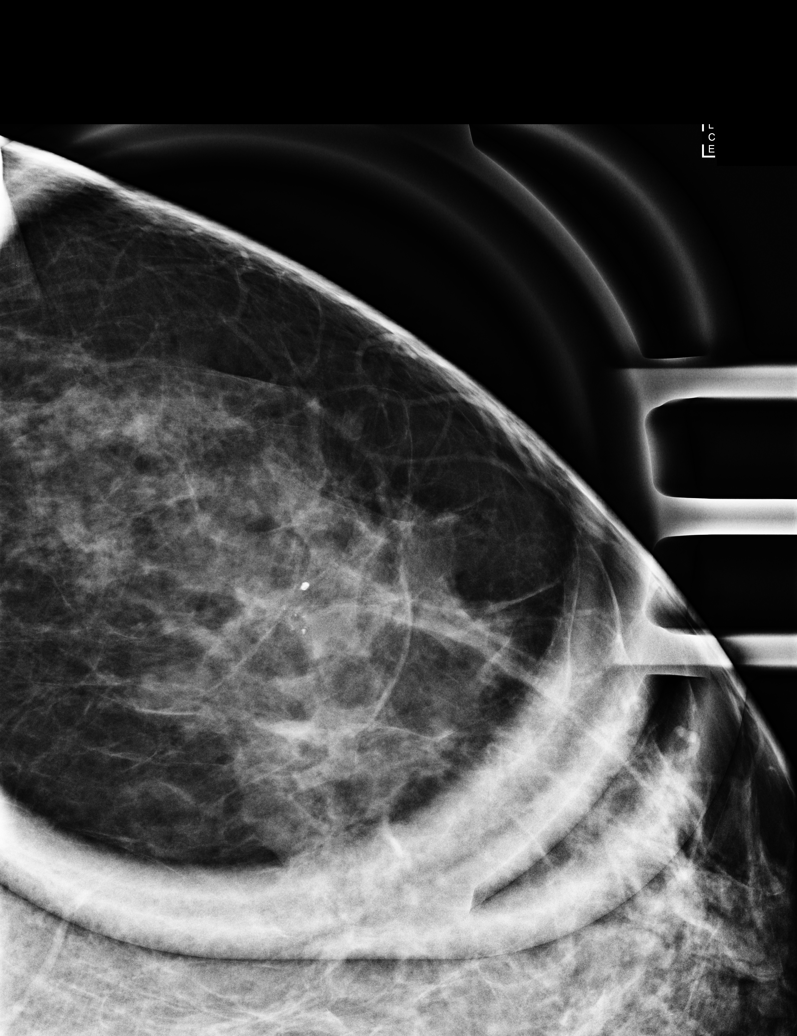

[R CC synth-2D]
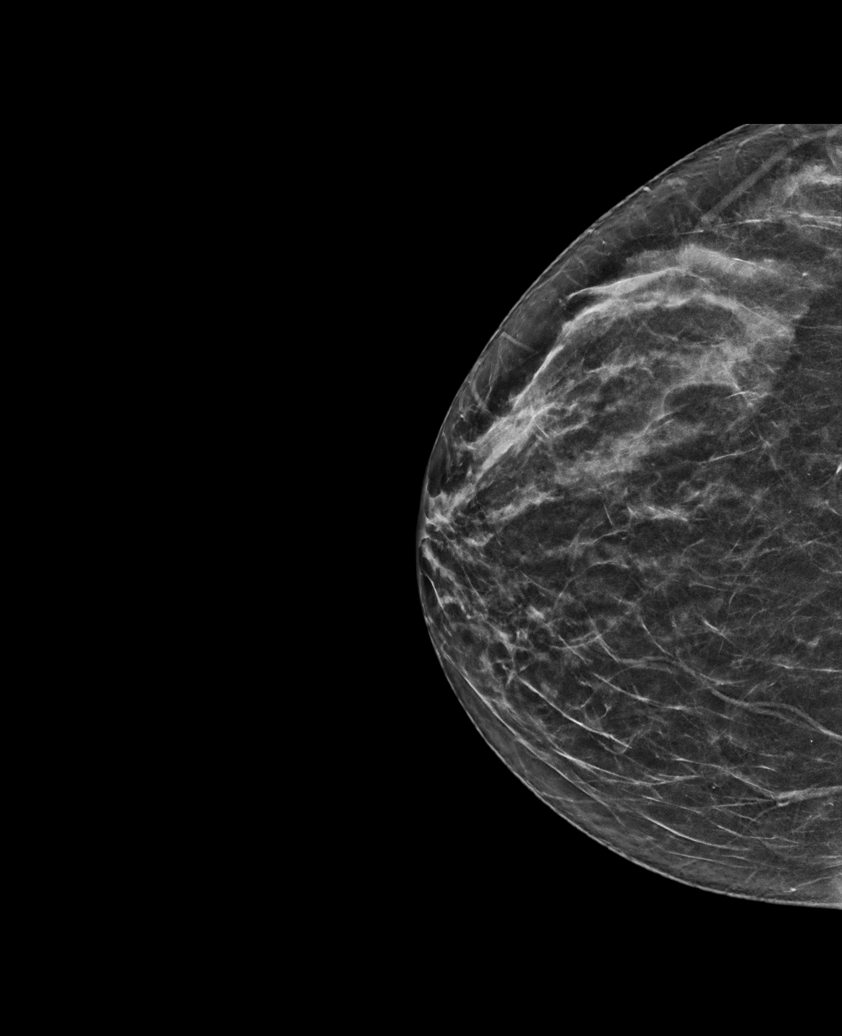

[L CC synth-2D]
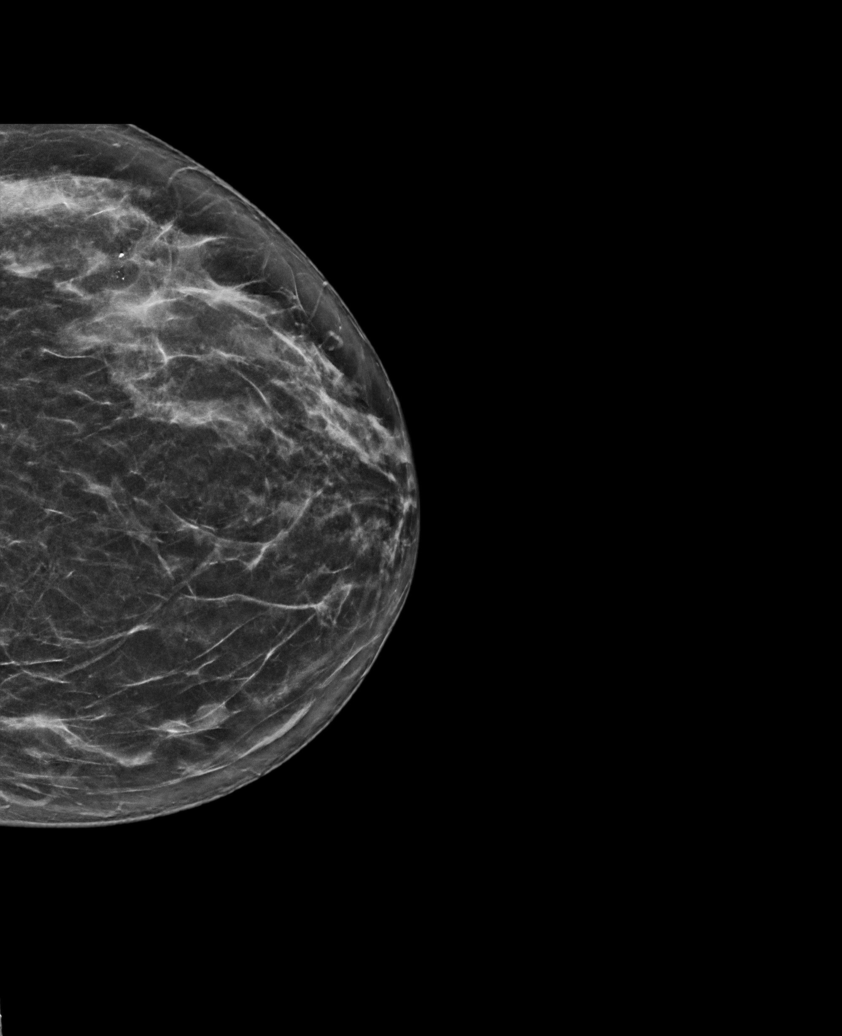

[R MLO synth-2D]
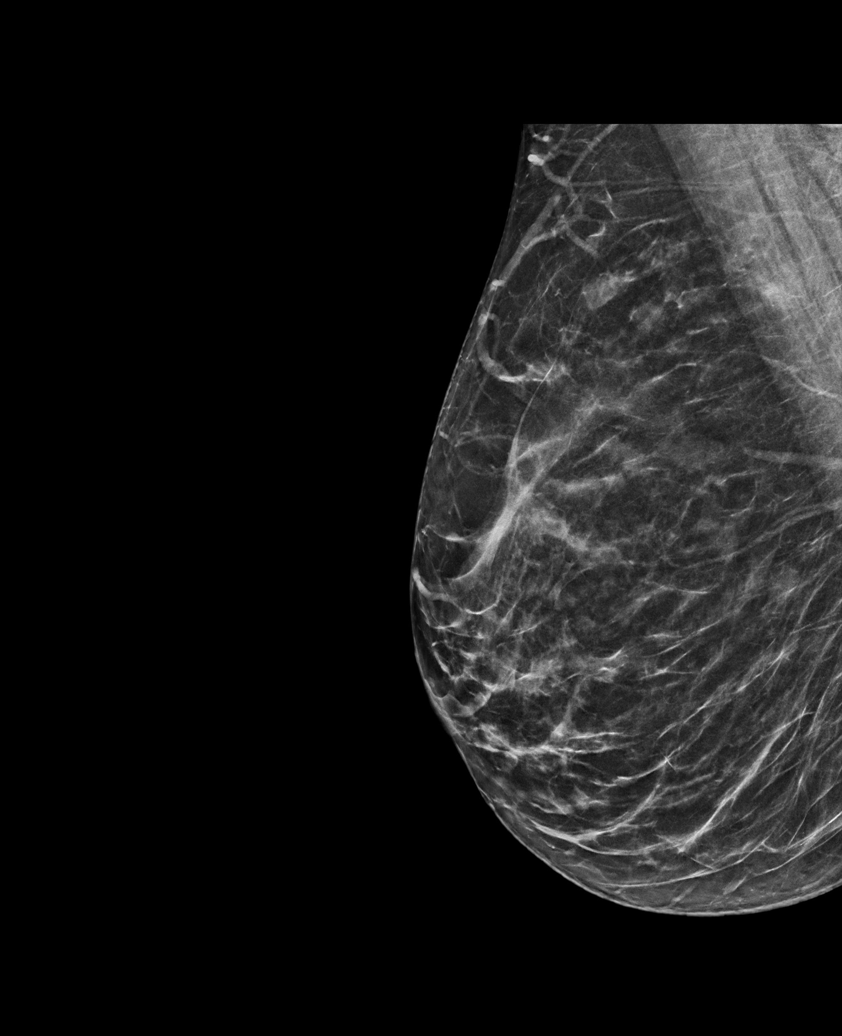

[L MLO synth-2D]
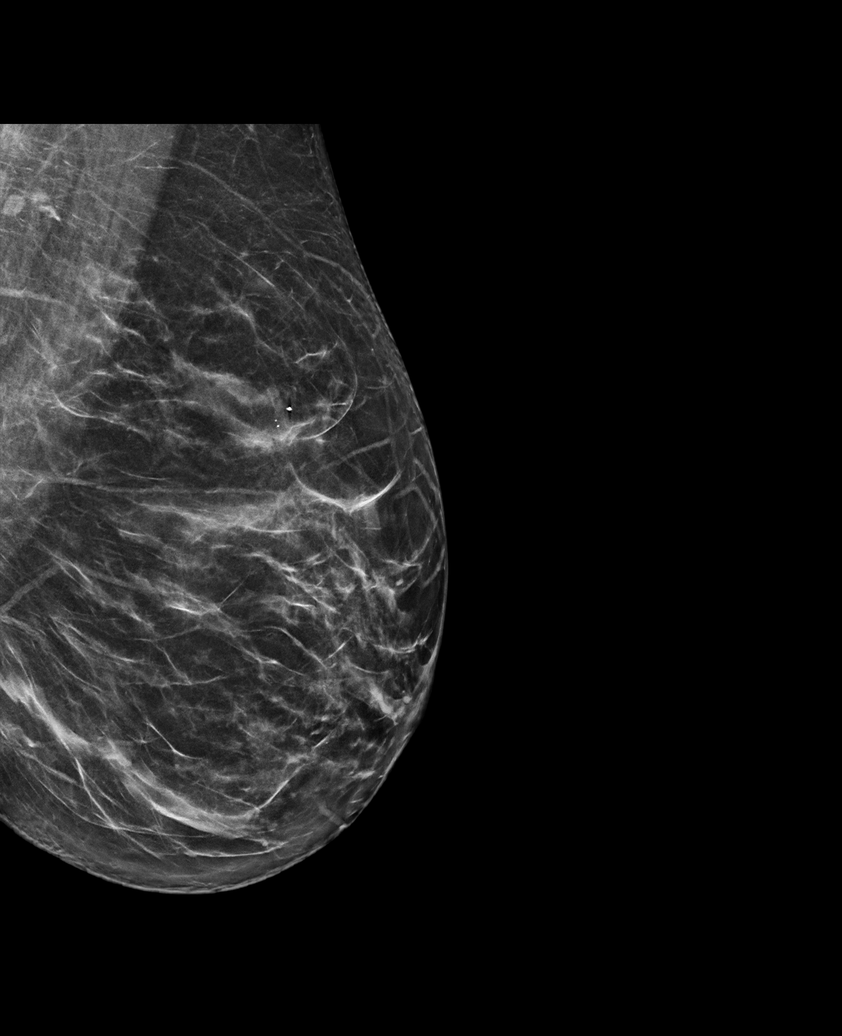

[6 of 26 positions shown; findings below may reference images not displayed]

ACR Breast Density Category c: The breast tissue is heterogeneously
dense, which may obscure small masses.
FINDINGS: The mass noted in the lower inner left breast is stable. The
calcifications noted in the upper outer left breast also unchanged.
The subtle asymmetry noted in the posterior upper outer right breast
on the MLO view is also stable.

There are no new masses, no areas of architectural distortion, no
new areas of asymmetry and no new or suspicious calcifications.

Mammographic images were processed with CAD.

Targeted left breast ultrasound is performed, showing a hypoechoic
circumscribed oval mass at 8 o'clock, 4 cm the nipple, measuring 10
x 5 x 9 mm, unchanged from the prior studies.
IMPRESSION: 1. No evidence of breast malignancy.
2. Benign left breast mass, consistent with a fibroadenoma. Benign
left breast calcifications and benign area of asymmetry in the
posterior upper outer right breast, all stable for over 2 years.

RECOMMENDATION:
Screening mammogram in one year.(Code:[id number])

I have discussed the findings and recommendations with the patient.
Results were also provided in writing at the conclusion of the
visit. If applicable, a reminder letter will be sent to the patient
regarding the next appointment.

BI-RADS CATEGORY  2: Benign.

## 2020-06-22 ENCOUNTER — Other Ambulatory Visit: Payer: Self-pay | Admitting: Physician Assistant

## 2020-06-22 DIAGNOSIS — Z1231 Encounter for screening mammogram for malignant neoplasm of breast: Secondary | ICD-10-CM

## 2021-02-09 ENCOUNTER — Other Ambulatory Visit: Payer: Self-pay | Admitting: Orthopedic Surgery

## 2021-03-04 NOTE — Progress Notes (Addendum)
Abigail Rhodes did not answer my calls or return my calls, I left instructions on voice mail:  I instructed the patient to arrive at [facility] Main entrance at 1030 AM on Monday, June 20 , register in the Admitting Office. DO NOT eat anything after midnight, on Sunday.  I instructed the patient to take the following medications in the am with just enough water to get them down: Hydroxyzine I asked patient to not wear any lotions, powders, cologne, jewelry, piercing, make-up or nail polish or artificial nails. I asked patient to shower with antibacteria soap, dry off with a clean towel,wear clean clothes. Brush teeth. I informed patient that there will need to be a driver and someone to stay with him/her for the first 24 hours after surgery.   I instructed  patient to call [phone], in the am of surgery or Saturday or Sunday,if there were any questions or problems.   I instructed patient to not take any vitamins, herbal products or Zyrtec D until after surgery.  I informed patient that she may drink clear liquids until 1000 on Monday, I went over the clear liquids that are allowed.

## 2021-03-07 ENCOUNTER — Other Ambulatory Visit: Payer: Self-pay

## 2021-03-07 ENCOUNTER — Encounter (HOSPITAL_COMMUNITY)
Admission: RE | Disposition: A | Discharge status: Home / Self Care | Payer: Self-pay | Source: Home / Self Care | Attending: Orthopedic Surgery

## 2021-03-07 ENCOUNTER — Ambulatory Visit (HOSPITAL_COMMUNITY): Payer: BC Managed Care – PPO | Admitting: Certified Registered"

## 2021-03-07 ENCOUNTER — Encounter (HOSPITAL_COMMUNITY): Payer: Self-pay | Admitting: Orthopedic Surgery

## 2021-03-07 ENCOUNTER — Ambulatory Visit (HOSPITAL_COMMUNITY)
Admission: RE | Admit: 2021-03-07 | Discharge: 2021-03-07 | Disposition: A | Discharge status: Home / Self Care | Payer: BC Managed Care – PPO | Attending: Orthopedic Surgery | Admitting: Orthopedic Surgery

## 2021-03-07 DIAGNOSIS — Z8249 Family history of ischemic heart disease and other diseases of the circulatory system: Secondary | ICD-10-CM | POA: Diagnosis not present

## 2021-03-07 DIAGNOSIS — Z888 Allergy status to other drugs, medicaments and biological substances status: Secondary | ICD-10-CM | POA: Diagnosis not present

## 2021-03-07 DIAGNOSIS — Z791 Long term (current) use of non-steroidal anti-inflammatories (NSAID): Secondary | ICD-10-CM | POA: Diagnosis not present

## 2021-03-07 DIAGNOSIS — G5602 Carpal tunnel syndrome, left upper limb: Secondary | ICD-10-CM | POA: Insufficient documentation

## 2021-03-07 DIAGNOSIS — F17210 Nicotine dependence, cigarettes, uncomplicated: Secondary | ICD-10-CM | POA: Insufficient documentation

## 2021-03-07 DIAGNOSIS — Z79899 Other long term (current) drug therapy: Secondary | ICD-10-CM | POA: Diagnosis not present

## 2021-03-07 HISTORY — PX: CARPAL TUNNEL RELEASE: SHX101

## 2021-03-07 HISTORY — DX: Bipolar disorder, unspecified: F31.9

## 2021-03-07 LAB — CBC
HCT: 41.5 % (ref 36.0–46.0)
Hemoglobin: 14.1 g/dL (ref 12.0–15.0)
MCH: 32.5 pg (ref 26.0–34.0)
MCHC: 34 g/dL (ref 30.0–36.0)
MCV: 95.6 fL (ref 80.0–100.0)
Platelets: 288 10*3/uL (ref 150–400)
RBC: 4.34 MIL/uL (ref 3.87–5.11)
RDW: 12.3 % (ref 11.5–15.5)
WBC: 7.9 10*3/uL (ref 4.0–10.5)
nRBC: 0 % (ref 0.0–0.2)

## 2021-03-07 SURGERY — CARPAL TUNNEL RELEASE
Anesthesia: Regional | Site: Wrist | Laterality: Left

## 2021-03-07 MED ORDER — CHLORHEXIDINE GLUCONATE 0.12 % MT SOLN
OROMUCOSAL | Status: AC
  Filled 2021-03-07: qty 15

## 2021-03-07 MED ORDER — ONDANSETRON HCL 4 MG/2ML IJ SOLN
INTRAMUSCULAR | Status: AC
  Filled 2021-03-07: qty 2

## 2021-03-07 MED ORDER — ONDANSETRON HCL 4 MG/2ML IJ SOLN
INTRAMUSCULAR | Status: DC | PRN
  Administered 2021-03-07: 4 mg via INTRAVENOUS

## 2021-03-07 MED ORDER — LACTATED RINGERS IV SOLN: INTRAVENOUS | Status: DC

## 2021-03-07 MED ORDER — CEFAZOLIN SODIUM-DEXTROSE 2-4 GM/100ML-% IV SOLN
2.0000 g | INTRAVENOUS | Status: AC
  Administered 2021-03-07: 2 g via INTRAVENOUS

## 2021-03-07 MED ORDER — ORAL CARE MOUTH RINSE: 15.0000 mL | Freq: Once | OROMUCOSAL | Status: AC

## 2021-03-07 MED ORDER — CEFAZOLIN SODIUM-DEXTROSE 2-4 GM/100ML-% IV SOLN
INTRAVENOUS | Status: AC
  Filled 2021-03-07: qty 100

## 2021-03-07 MED ORDER — FENTANYL CITRATE (PF) 250 MCG/5ML IJ SOLN
INTRAMUSCULAR | Status: AC
  Filled 2021-03-07: qty 5

## 2021-03-07 MED ORDER — PROPOFOL 500 MG/50ML IV EMUL
INTRAVENOUS | Status: DC | PRN
  Administered 2021-03-07 (×2): 20 mg via INTRAVENOUS
  Administered 2021-03-07: 100 ug/kg/min via INTRAVENOUS

## 2021-03-07 MED ORDER — FENTANYL CITRATE (PF) 100 MCG/2ML IJ SOLN
INTRAMUSCULAR | Status: DC | PRN
  Administered 2021-03-07: 50 ug via INTRAVENOUS

## 2021-03-07 MED ORDER — PROMETHAZINE HCL 25 MG/ML IJ SOLN: 6.2500 mg | INTRAMUSCULAR | Status: DC | PRN

## 2021-03-07 MED ORDER — FENTANYL CITRATE (PF) 100 MCG/2ML IJ SOLN: 25.0000 ug | INTRAMUSCULAR | Status: DC | PRN

## 2021-03-07 MED ORDER — PROPOFOL 10 MG/ML IV BOLUS
INTRAVENOUS | Status: AC
  Filled 2021-03-07: qty 20

## 2021-03-07 MED ORDER — BUPIVACAINE HCL (PF) 0.25 % IJ SOLN
INTRAMUSCULAR | Status: DC | PRN
  Administered 2021-03-07: 9 mL

## 2021-03-07 MED ORDER — CHLORHEXIDINE GLUCONATE 0.12 % MT SOLN
15.0000 mL | Freq: Once | OROMUCOSAL | Status: AC
  Administered 2021-03-07: 15 mL via OROMUCOSAL

## 2021-03-07 MED ORDER — MIDAZOLAM HCL 2 MG/2ML IJ SOLN
INTRAMUSCULAR | Status: DC | PRN
  Administered 2021-03-07: 2 mg via INTRAVENOUS

## 2021-03-07 MED ORDER — BUPIVACAINE HCL (PF) 0.25 % IJ SOLN
INTRAMUSCULAR | Status: AC
  Filled 2021-03-07: qty 30

## 2021-03-07 MED ORDER — ACETAMINOPHEN 10 MG/ML IV SOLN: 1000.0000 mg | Freq: Once | INTRAVENOUS | Status: DC | PRN

## 2021-03-07 MED ORDER — MIDAZOLAM HCL 2 MG/2ML IJ SOLN
INTRAMUSCULAR | Status: AC
  Filled 2021-03-07: qty 2

## 2021-03-07 MED ORDER — HYDROCODONE-ACETAMINOPHEN 5-325 MG PO TABS: ORAL_TABLET | ORAL | 0 refills | Status: AC

## 2021-03-07 SURGICAL SUPPLY — 40 items
BNDG ELASTIC 3X5.8 VLCR STR LF (GAUZE/BANDAGES/DRESSINGS) ×2 IMPLANT
BNDG ELASTIC 4X5.8 VLCR STR LF (GAUZE/BANDAGES/DRESSINGS) IMPLANT
BNDG ESMARK 4X9 LF (GAUZE/BANDAGES/DRESSINGS) ×2 IMPLANT
BNDG GAUZE ELAST 4 BULKY (GAUZE/BANDAGES/DRESSINGS) ×2 IMPLANT
CHLORAPREP W/TINT 26 (MISCELLANEOUS) ×2 IMPLANT
CORD BIPOLAR FORCEPS 12FT (ELECTRODE) ×2 IMPLANT
COVER SURGICAL LIGHT HANDLE (MISCELLANEOUS) ×2 IMPLANT
COVER WAND RF STERILE (DRAPES) IMPLANT
CUFF TOURN SGL QUICK 18X4 (TOURNIQUET CUFF) ×2 IMPLANT
CUFF TOURN SGL QUICK 24 (TOURNIQUET CUFF)
CUFF TRNQT CYL 24X4X16.5-23 (TOURNIQUET CUFF) IMPLANT
DRAPE SURG 17X23 STRL (DRAPES) ×2 IMPLANT
DRSG PAD ABDOMINAL 8X10 ST (GAUZE/BANDAGES/DRESSINGS) ×2 IMPLANT
GAUZE SPONGE 4X4 12PLY STRL (GAUZE/BANDAGES/DRESSINGS) ×2 IMPLANT
GAUZE XEROFORM 1X8 LF (GAUZE/BANDAGES/DRESSINGS) ×2 IMPLANT
GLOVE BIO SURGEON STRL SZ7.5 (GLOVE) ×4 IMPLANT
GLOVE SRG 8 PF TXTR STRL LF DI (GLOVE) ×1 IMPLANT
GLOVE SURG UNDER POLY LF SZ8 (GLOVE) ×2
GOWN STRL REUS W/ TWL LRG LVL3 (GOWN DISPOSABLE) ×2 IMPLANT
GOWN STRL REUS W/TWL LRG LVL3 (GOWN DISPOSABLE) ×2
KIT BASIN OR (CUSTOM PROCEDURE TRAY) ×2 IMPLANT
KIT TURNOVER KIT B (KITS) ×2 IMPLANT
NEEDLE HYPO 25GX1X1/2 BEV (NEEDLE) ×2 IMPLANT
NS IRRIG 1000ML POUR BTL (IV SOLUTION) ×2 IMPLANT
PACK ORTHO EXTREMITY (CUSTOM PROCEDURE TRAY) ×2 IMPLANT
PAD ARMBOARD 7.5X6 YLW CONV (MISCELLANEOUS) IMPLANT
PADDING CAST ABS 4INX4YD NS (CAST SUPPLIES) ×1
PADDING CAST ABS COTTON 4X4 ST (CAST SUPPLIES) ×1 IMPLANT
SPONGE LAP 4X18 RFD (DISPOSABLE) IMPLANT
SUCTION FRAZIER HANDLE 10FR (MISCELLANEOUS)
SUCTION TUBE FRAZIER 10FR DISP (MISCELLANEOUS) IMPLANT
SUT ETHILON 3 0 PS 1 (SUTURE) IMPLANT
SUT ETHILON 4 0 PS 2 18 (SUTURE) ×2 IMPLANT
SUT VIC AB 3-0 SH 18 (SUTURE) ×2 IMPLANT
SYR CONTROL 10ML LL (SYRINGE) ×2 IMPLANT
TOWEL GREEN STERILE (TOWEL DISPOSABLE) IMPLANT
TOWEL GREEN STERILE FF (TOWEL DISPOSABLE) ×2 IMPLANT
TUBE CONNECTING 12X1/4 (SUCTIONS) IMPLANT
UNDERPAD 30X36 HEAVY ABSORB (UNDERPADS AND DIAPERS) ×2 IMPLANT
WATER STERILE IRR 1000ML POUR (IV SOLUTION) ×2 IMPLANT

## 2021-03-07 NOTE — Discharge Instructions (Addendum)

## 2021-03-07 NOTE — Anesthesia Postprocedure Evaluation (Signed)
Anesthesia Post Note  Patient: Abigail Rhodes  Procedure(s) Performed: LEFT CARPAL TUNNEL RELEASE (Left: Wrist)     Patient location during evaluation: PACU Anesthesia Type: MAC Level of consciousness: awake and alert Pain management: pain level controlled Vital Signs Assessment: post-procedure vital signs reviewed and stable Respiratory status: spontaneous breathing, nonlabored ventilation, respiratory function stable and patient connected to nasal cannula oxygen Cardiovascular status: stable and blood pressure returned to baseline Postop Assessment: no apparent nausea or vomiting Anesthetic complications: no   No notable events documented.  Last Vitals:  Vitals:   03/07/21 1447 03/07/21 1452  BP: 127/86 (!) 137/91  Pulse: 74 81  Resp: 14 13  Temp:  36.8 C  SpO2: 100% 98%    Last Pain:  Vitals:   03/07/21 1452  TempSrc:   PainSc: 0-No pain                 Belenda Cruise P Mazin Emma

## 2021-03-07 NOTE — H&P (Signed)
Abigail Rhodes is an 45 y.o. female.   Chief Complaint: carpal tunnel syndrome HPI: 45 yo female with numbness and tingling in fingers.  Positive nerve conduction studies.  She wishes to have carpal tunnel release for management of symptoms.  Allergies:  Allergies  Allergen Reactions   Lamotrigine Diarrhea   Sertraline Hcl     Agitation    Past Medical History:  Diagnosis Date   Bipolar disorder (HCC)    Depression     Past Surgical History:  Procedure Laterality Date   FOOT SURGERY     PARTIAL HYSTERECTOMY     TONSILLECTOMY     TUBAL LIGATION      Family History: Family History  Problem Relation Age of Onset   Hypertension Father     Social History:   reports that she has been smoking cigarettes. She has been smoking an average of 1.50 packs per day. She has never used smokeless tobacco. She reports current alcohol use. She reports that she does not use drugs.  Medications: Medications Prior to Admission  Medication Sig Dispense Refill   cetirizine-pseudoephedrine (ZYRTEC-D) 5-120 MG tablet Take 1 tablet by mouth daily as needed for allergies.     gabapentin (NEURONTIN) 600 MG tablet Take 2,400 mg by mouth at bedtime.     ibuprofen (ADVIL) 200 MG tablet Take 400-800 mg by mouth every 6 (six) hours as needed for moderate pain.     MACA ROOT PO Take 2 tablets by mouth daily.     Multiple Vitamin (MULTIVITAMIN WITH MINERALS) TABS tablet Take 1 tablet by mouth daily.     traZODone (DESYREL) 100 MG tablet Take 1 tablet (100 mg total) by mouth at bedtime as needed for sleep. (Patient taking differently: Take 200 mg by mouth at bedtime.) 30 tablet 0   vitamin E 180 MG (400 UNITS) capsule Take 400 Units by mouth daily.     gabapentin (NEURONTIN) 300 MG capsule Take 1 capsule (300 mg total) by mouth 2 (two) times daily. (Patient not taking: No sig reported) 60 capsule 0   hydrOXYzine (ATARAX/VISTARIL) 25 MG tablet Take 25 mg by mouth 2 (two) times daily as needed for  anxiety.     lamoTRIgine (LAMICTAL) 25 MG tablet Take 1 tablet (25 mg total) by mouth daily. Take 1 tablet for 2 weeks then increase to 2 tablets for 2 weeks until next follow up (Patient not taking: Reported on 03/01/2021) 42 tablet 0    Results for orders placed or performed during the hospital encounter of 03/07/21 (from the past 48 hour(s))  CBC per protocol     Status: None   Collection Time: 03/07/21 11:32 AM  Result Value Ref Range   WBC 7.9 4.0 - 10.5 K/uL   RBC 4.34 3.87 - 5.11 MIL/uL   Hemoglobin 14.1 12.0 - 15.0 g/dL   HCT 28.7 68.1 - 15.7 %   MCV 95.6 80.0 - 100.0 fL   MCH 32.5 26.0 - 34.0 pg   MCHC 34.0 30.0 - 36.0 g/dL   RDW 26.2 03.5 - 59.7 %   Platelets 288 150 - 400 K/uL   nRBC 0.0 0.0 - 0.2 %    Comment: Performed at [facility], [address] [place], [place] [address]    No results found.   A comprehensive review of systems was negative.  Blood pressure 125/87, pulse 72, temperature 98.4 F (36.9 C), temperature source Oral, resp. rate 18, height 5\' 3"  (1.6 m), weight 65.8 kg, SpO2  95 %.  General appearance: alert, cooperative, and appears stated age Head: Normocephalic, without obvious abnormality, atraumatic Neck: supple, symmetrical, trachea midline Cardio: regular rate and rhythm Resp: clear to auscultation bilaterally Extremities: Intact sensation and capillary refill all digits.  +epl/fpl/io.  No wounds.  Pulses: 2+ and symmetric Skin: Skin color, texture, turgor normal. No rashes or lesions Neurologic: Grossly normal Incision/Wound: none  Assessment/Plan Left carpal tunnel syndrome.  Non operative and operative treatment options have been discussed with the patient and patient wishes to proceed with operative treatment. Risks, benefits, and alternatives of surgery have been discussed and the patient agrees with the plan of care.   [name] 03/07/2021, 1:50 PM

## 2021-03-07 NOTE — Op Note (Signed)
03/07/2021 [facility] OR                              OPERATIVE REPORT   PREOPERATIVE DIAGNOSIS:  Left carpal tunnel syndrome.  POSTOPERATIVE DIAGNOSIS:  Left carpal tunnel syndrome.  PROCEDURE:  Left carpal tunnel release.  SURGEON:  [name], MD  ASSISTANT:  none.  ANESTHESIA: Bier block with sedation  IV FLUIDS:  Per anesthesia flow sheet.  ESTIMATED BLOOD LOSS:  Minimal.  COMPLICATIONS:  None.  SPECIMENS:  None.  TOURNIQUET TIME:    Total Tourniquet Time Documented: area (Left) - 21 minutes Total: area (Left) - 21 minutes   DISPOSITION:  Stable to PACU.  LOCATION: [facility] OR  INDICATIONS:  45 yo female with numbness and tingling left hand.  Positive nerve conduction studies.  She wishes to have a carpal tunnel release for management of her symptoms.  Risks, benefits and alternatives of surgery were discussed including the risk of blood loss; infection; damage to nerves, vessels, tendons, ligaments, bone; failure of surgery; need for additional surgery; complications with wound healing; continued pain; recurrence of carpal tunnel syndrome; and damage to motor branch. She voiced understanding of these risks and elected to proceed.   OPERATIVE COURSE:  After being identified preoperatively by myself, the patient and I agreed upon the procedure and site of procedure.  The surgical site was marked.  Surgical consent had been signed.  She was given IV Ancef as preoperative antibiotic prophylaxis.  She was transferred to the operating room and placed on the operating room table in supine position with the Left upper extremity on an armboard.  Bier block anesthesia was induced by the anesthesiologist.  Left upper extremity was prepped and draped in normal sterile orthopaedic fashion.  A surgical pause was performed between the surgeons, anesthesia, and operating room staff, and all were in agreement as to the patient, procedure, and site of procedure.  Tourniquet at the proximal aspect of the  forearm had been inflated for the Bier block  Incision was made over the transverse carpal ligament and carried into the subcutaneous tissues by spreading technique.  Bipolar electrocautery was used to obtain hemostasis.  The palmar fascia was sharply incised.  The transverse carpal ligament was identified and sharply incised.  It was incised distally first.  The flexor tendons were identified.  The flexor tendon to the ring finger was identified and retracted radially.  The transverse carpal ligament was then incised proximally.  Scissors were used to split the distal aspect of the volar antebrachial fascia.  A finger was placed into the wound to ensure complete decompression, which was the case.  The nerve was examined.  It was adherent to the radial leaflet.  The motor branch was identified and was intact.  The wound was copiously irrigated with sterile saline.  It was then closed with 4-0 nylon in a horizontal mattress fashion.  It was injected with 0.25% plain Marcaine to aid in postoperative analgesia.  It was dressed with sterile Xeroform, 4x4s, an ABD, and wrapped with Kerlix and an Ace bandage.  Tourniquet was deflated at 21 minutes.  Fingertips were pink with brisk capillary refill after deflation of the tourniquet.  Operative drapes were broken down.  The patient was awoken from anesthesia safely.  She was transferred back to stretcher and taken to the PACU in stable condition.  I will see her back in the office in 1 week for postoperative followup.  I will give her a prescription for Norco 5/325 1-2 tabs PO q6 hours prn pain, dispense # 15.    [name], MD Electronically signed, 03/07/21

## 2021-03-07 NOTE — Anesthesia Preprocedure Evaluation (Signed)
Anesthesia Evaluation  Patient identified by MRN, date of birth, ID band Patient awake    Reviewed: Allergy & Precautions, NPO status , Patient's Chart, lab work & pertinent test results  Airway Mallampati: II  TM Distance: >3 FB Neck ROM: Full    Dental  (+) Teeth Intact   Pulmonary neg pulmonary ROS, Current Smoker,    Pulmonary exam normal        Cardiovascular negative cardio ROS   Rhythm:Regular Rate:Normal     Neuro/Psych Seizures -, Well Controlled,  Anxiety Depression Bipolar Disorder    GI/Hepatic negative GI ROS, Neg liver ROS,   Endo/Other  negative endocrine ROS  Renal/GU negative Renal ROS  negative genitourinary   Musculoskeletal Left CTS   Abdominal (+)  Abdomen: soft. Bowel sounds: normal.  Peds  Hematology negative hematology ROS (+)   Anesthesia Other Findings   Reproductive/Obstetrics                             Anesthesia Physical Anesthesia Plan  ASA: 3  Anesthesia Plan: Bier Block and Bier Block-LIDOCAINE ONLY   Post-op Pain Management:    Induction: Intravenous  PONV Risk Score and Plan: 1 and Ondansetron, Propofol infusion, Midazolam and Treatment may vary due to age or medical condition  Airway Management Planned: Simple Face Mask, Natural Airway and Nasal Cannula  Additional Equipment: None  Intra-op Plan:   Post-operative Plan:   Informed Consent: I have reviewed the patients History and Physical, chart, labs and discussed the procedure including the risks, benefits and alternatives for the proposed anesthesia with the patient or authorized representative who has indicated his/her understanding and acceptance.     Dental advisory given  Plan Discussed with: CRNA  Anesthesia Plan Comments:         Anesthesia Quick Evaluation

## 2021-03-07 NOTE — Anesthesia Procedure Notes (Signed)
Anesthesia Regional Block: Bier block (IV Regional)   Pre-Anesthetic Checklist: , timeout performed,  Correct Patient, Correct Site, Correct Laterality,  Correct Procedure, Correct Position, risks and benefits discussed,  Surgical consent,  Pre-op evaluation,  At surgeon's request  Laterality: Right  Prep: chloraprep        Procedures:,,,,, intact distal pulses, Esmarch exsanguination,  Single tourniquet utilized,  #20gu IV placed    Narrative:  Start time: 03/07/2021 2:03 PM End time: 03/07/2021 2:04 PM  Performed by: Personally

## 2021-03-07 NOTE — Transfer of Care (Signed)
Immediate Anesthesia Transfer of Care Note  Patient: Abigail Rhodes  Procedure(s) Performed: LEFT CARPAL TUNNEL RELEASE (Left: Wrist)  Patient Location: PACU  Anesthesia Type:Bier block  Level of Consciousness: awake, alert  and oriented  Airway & Oxygen Therapy: Patient Spontanous Breathing and Patient connected to face mask oxygen  Post-op Assessment: Report given to RN and Post -op Vital signs reviewed and stable  Post vital signs: Reviewed and stable  Last Vitals:  Vitals Value Taken Time  BP 116/74 03/07/21 1432  Temp    Pulse 70 03/07/21 1434  Resp 13 03/07/21 1434  SpO2 100 % 03/07/21 1434  Vitals shown include unvalidated device data.  Last Pain:  Vitals:   03/07/21 1154  TempSrc:   PainSc: 2       Patients Stated Pain Goal: 2 (03/07/21 1154)  Complications: No notable events documented.

## 2021-03-08 ENCOUNTER — Encounter (HOSPITAL_COMMUNITY): Payer: Self-pay | Admitting: Orthopedic Surgery

## 2022-04-18 ENCOUNTER — Other Ambulatory Visit: Payer: Self-pay | Admitting: Physician Assistant

## 2022-04-18 DIAGNOSIS — Z1231 Encounter for screening mammogram for malignant neoplasm of breast: Secondary | ICD-10-CM

## 2022-04-27 ENCOUNTER — Ambulatory Visit
Admission: RE | Admit: 2022-04-27 | Discharge: 2022-04-27 | Disposition: A | Discharge status: Home / Self Care | Payer: BC Managed Care – PPO | Source: Ambulatory Visit | Attending: Physician Assistant | Admitting: Physician Assistant

## 2022-04-27 DIAGNOSIS — Z1231 Encounter for screening mammogram for malignant neoplasm of breast: Secondary | ICD-10-CM

## 2024-05-13 ENCOUNTER — Encounter: Payer: Self-pay | Admitting: Lactation Services

## 2024-05-23 NOTE — Telephone Encounter (Signed)
 A user error has taken place: encounter opened in error, closed for administrative reasons.
